# Patient Record
Sex: Female | Born: 1940 | Race: White | Hispanic: No | Smoking: Never smoker
Health system: Southern US, Community
[De-identification: ages and names within clinical notes are randomized; demographics above are authoritative.]

## PROBLEM LIST (undated history)

## (undated) ENCOUNTER — Emergency Department (HOSPITAL_COMMUNITY): Admission: EM | Source: Home / Self Care

## (undated) DIAGNOSIS — J939 Pneumothorax, unspecified: Secondary | ICD-10-CM

## (undated) DIAGNOSIS — E785 Hyperlipidemia, unspecified: Secondary | ICD-10-CM

## (undated) DIAGNOSIS — H3530 Unspecified macular degeneration: Secondary | ICD-10-CM

## (undated) DIAGNOSIS — G4700 Insomnia, unspecified: Secondary | ICD-10-CM

## (undated) DIAGNOSIS — D61818 Other pancytopenia: Secondary | ICD-10-CM

## (undated) DIAGNOSIS — M1990 Unspecified osteoarthritis, unspecified site: Secondary | ICD-10-CM

## (undated) DIAGNOSIS — C801 Malignant (primary) neoplasm, unspecified: Secondary | ICD-10-CM

## (undated) DIAGNOSIS — J189 Pneumonia, unspecified organism: Secondary | ICD-10-CM

## (undated) DIAGNOSIS — C50919 Malignant neoplasm of unspecified site of unspecified female breast: Secondary | ICD-10-CM

## (undated) DIAGNOSIS — K219 Gastro-esophageal reflux disease without esophagitis: Secondary | ICD-10-CM

## (undated) HISTORY — PX: BREAST EXCISIONAL BIOPSY: SUR124

---

## 2005-03-11 ENCOUNTER — Emergency Department: Payer: Self-pay | Admitting: Emergency Medicine

## 2005-07-29 ENCOUNTER — Ambulatory Visit: Payer: Self-pay | Admitting: General Practice

## 2007-02-15 ENCOUNTER — Ambulatory Visit: Payer: Self-pay | Admitting: Internal Medicine

## 2008-02-24 ENCOUNTER — Ambulatory Visit: Payer: Self-pay | Admitting: Family Medicine

## 2008-03-23 ENCOUNTER — Other Ambulatory Visit: Payer: Self-pay

## 2008-03-23 ENCOUNTER — Ambulatory Visit: Payer: Self-pay | Admitting: General Practice

## 2008-04-04 ENCOUNTER — Ambulatory Visit: Payer: Self-pay | Admitting: General Practice

## 2008-04-04 HISTORY — PX: KNEE ARTHROSCOPY: SHX127

## 2008-10-05 ENCOUNTER — Emergency Department: Payer: Self-pay | Admitting: Unknown Physician Specialty

## 2010-02-22 ENCOUNTER — Ambulatory Visit: Payer: Self-pay | Admitting: Family Medicine

## 2010-09-30 ENCOUNTER — Inpatient Hospital Stay: Payer: Self-pay | Admitting: Specialist

## 2010-09-30 HISTORY — PX: ANKLE FRACTURE SURGERY: SHX122

## 2011-09-09 ENCOUNTER — Ambulatory Visit: Payer: Self-pay | Admitting: Internal Medicine

## 2011-10-23 ENCOUNTER — Ambulatory Visit: Payer: Self-pay | Admitting: Gastroenterology

## 2011-10-24 LAB — PATHOLOGY REPORT

## 2012-09-22 ENCOUNTER — Ambulatory Visit: Payer: Self-pay | Admitting: Internal Medicine

## 2013-10-22 DIAGNOSIS — C50919 Malignant neoplasm of unspecified site of unspecified female breast: Secondary | ICD-10-CM

## 2013-10-22 HISTORY — DX: Malignant neoplasm of unspecified site of unspecified female breast: C50.919

## 2013-10-31 ENCOUNTER — Ambulatory Visit: Payer: Self-pay | Admitting: Family Medicine

## 2013-11-14 ENCOUNTER — Ambulatory Visit: Payer: Self-pay | Admitting: Family Medicine

## 2013-11-15 ENCOUNTER — Ambulatory Visit: Payer: Self-pay | Admitting: Family Medicine

## 2013-11-29 ENCOUNTER — Ambulatory Visit: Payer: Self-pay | Admitting: Surgery

## 2013-12-06 ENCOUNTER — Ambulatory Visit: Payer: Self-pay | Admitting: Surgery

## 2013-12-06 HISTORY — PX: PARTIAL MASTECTOMY WITH AXILLARY SENTINEL LYMPH NODE BIOPSY: SHX6004

## 2013-12-20 ENCOUNTER — Ambulatory Visit: Payer: Self-pay | Admitting: Oncology

## 2013-12-29 ENCOUNTER — Ambulatory Visit: Payer: Self-pay | Admitting: Surgery

## 2013-12-29 HISTORY — PX: MASTECTOMY: SHX3

## 2014-01-03 LAB — PATHOLOGY REPORT

## 2014-02-01 ENCOUNTER — Ambulatory Visit: Payer: Self-pay | Admitting: Oncology

## 2014-02-19 ENCOUNTER — Ambulatory Visit: Payer: Self-pay | Admitting: Oncology

## 2014-11-06 ENCOUNTER — Ambulatory Visit: Payer: Self-pay | Admitting: Family Medicine

## 2015-04-13 NOTE — Op Note (Signed)
PATIENT NAME:  Samantha Ford, Samantha Ford MR#:  [id number] DATE OF BIRTH:  December 27, 1940  DATE OF PROCEDURE:  12/06/2013  PREOPERATIVE DIAGNOSIS: Carcinoma of the right breast.   POSTOPERATIVE DIAGNOSIS: Carcinoma of the right breast.   PROCEDURE: Right partial mastectomy with axillary sentinel lymph node biopsy.   SURGEON: Rochel Brome, M.D.   ANESTHESIA: General.   INDICATIONS: This 74 year old female recently had abnormal mammogram with finding of an area of density in the retroareolar portion of the right breast. Ultrasound demonstrated a 1.35 cm lesion, which is superficially located 8 mm deep to the skin at the 11:30 position right breast 1 cm from the nipple. Surgery was recommended for definitive treatment. Did have preoperative injection of radioactive technetium sulfur colloid.   DESCRIPTION OF PROCEDURE: The patient was placed on the operating table in the supine position under general endotracheal anesthesia. The right arm was placed on a lateral arm support. The breast was prepared with ChloraPrep and draped in a sterile manner.   The Gamma counter was used to probe the axilla demonstrating location of radioactivity in the inferior aspect of the axilla. An oblique incision was made some 4 cm in length, carried down through subcutaneous tissues. One traversing vein was divided between 4-0 chromic ligatures. The superficial fascia was incised. Dissection was carried down deeply within the axilla adjacent to the rib cage to encounter an area of radioactivity. There was a small lymph node not more than about 5 mm, which was excised with some surrounding fatty tissue. The ex vivo count was in the range of 300 to 400 counts per second. The background count was less than 10 counts per second There was no remaining palpable mass within the axilla. It is noted that one clamped vessel was suture ligated with 4-0 chromic. Hemostasis was subsequently intact. The sentinel lymph node was submitted for routine  pathology.   Next, attention was turned to do the right partial mastectomy. A curvilinear incision was made from medial to lateral above and below the areola and carried down through subcutaneous tissues, and began to palpate the mass at the 11:30 position, retroareolar portion of the breast and dissection was carried out while palpating the mass. It is noted that there was one place where the dissection came very close to the mass and did back up and re-direct the course of dissection to include a wider margin. The specimen was submitted with a stitch at the medial end of the skin ellipse and also margin maps were sutured to the specimen to mark the medial, lateral, cranial, caudal, and deep margins and this was submitted for pathology. The wound was inspected. Hemostasis was intact. There was no remaining visible mass within the wound.   Next, the axillary wound was further inspected. Hemostasis was intact. The wound was closed with running 4-0 Monocryl subcuticular suture and subsequently, the breast wound was further inspected. The pathologist called to say that the place where the dissection came close to the tumor was appreciated and also could see where I had backed up and obtained a wider margin. The wound was subsequently closed with 4-0 chromic subcutaneous sutures and also a running 4-0 Monocryl subcuticular suture and both wounds were treated with Dermabond. The patient tolerated surgery satisfactorily and was then prepared for transfer to the recovery room    ____________________________ J. Rochel Brome, MD jws:dp D: 12/06/2013 15:42:20 ET T: 12/06/2013 16:13:47 ET JOB#: [id number]  cc: Loreli Dollar, MD, <Dictator> Loreli Dollar MD ELECTRONICALLY SIGNED 12/07/2013 10:34

## 2015-04-14 NOTE — Op Note (Signed)
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   PROCEDURE: Right mastectomy.   SURGEON: Rochel Brome, M.D.   ANESTHESIA: General.   INDICATIONS: This 74 year old female recently had biopsy findings of infiltrating lobular carcinoma of the right breast. She did have a partial mastectomy including the central portion of the right breast. However, there were margins involved three sides of the resection, despite having a fairly large resection and simple mastectomy was recommended for additional treatment. As noted, her sentinel lymph node biopsy was negative.   PROCEDURE IN DETAIL: The patient was placed on the operating table in the supine position under general anesthesia. The right arm was placed on a lateral arm support. The right breast was prepared with ChloraPrep and draped in a sterile manner. It is noted that she had previously had the right partial mastectomy, which included the nipple and areolar.   A curvilinear incision was made from medial to lateral and removed an ellipse of skin in continuity with the underlying breast specimen. Silk sutures were placed along the skin edges to use for traction. Skin and subcutaneous flaps were raised using electrocautery, carrying these flaps down to the inferior mammary fold medially to the sternum, superiorly in the direction of the clavicle and laterally to the latissimus dorsi muscle. Next, the breast was elevated off the underlying deep fascia from medial to lateral with electrocautery and dissected up to include the axillary tail and extended up to the scar tissue at the site of the recent sentinel lymph node biopsy. The lateral end of the skin ellipse was tagged with a stitch for the pathologist's orientation. There was no grossly  apparent tumor in the resected margins and was submitted for routine pathology.   The wound was inspected. Several small bleeding points were cauterized. It is noted that during the course of the procedure, a number of clamped vessels were suture ligated with 3-0 chromic and by the end of the procedure hemostasis was intact. Two 15 French Blake drains were inserted through separate inferior stab wounds; one was placed up into the axilla, another along the anterior chest wall. These were secured to the skin with 3-0 nylon sutures. Next, the skin edges were approximated with running 4-0 Monocryl subcuticular suture and Dermabond. The drains were activated. There was scant serosanguineous drainage. Dressing was applied around the drain site using 4 x 4 gauze, benzoin and 2 inches silk tape.   The patient appeared to be in satisfactory condition and was prepared  for transfer to the recovery room.    ____________________________ Lenna Sciara. Rochel Brome, MD jws:cc D: 12/29/2013 15:00:13 ET T: 12/29/2013 20:40:22 ET JOB#: [id number]  cc: Loreli Dollar, MD, <Dictator> Loreli Dollar MD ELECTRONICALLY SIGNED 01/04/2014 18:08

## 2015-10-31 ENCOUNTER — Other Ambulatory Visit: Payer: Self-pay | Admitting: Family Medicine

## 2015-10-31 DIAGNOSIS — Z1231 Encounter for screening mammogram for malignant neoplasm of breast: Secondary | ICD-10-CM

## 2015-11-09 ENCOUNTER — Ambulatory Visit
Admission: RE | Admit: 2015-11-09 | Discharge: 2015-11-09 | Disposition: A | Discharge status: Home / Self Care | Payer: Medicare Other | Source: Ambulatory Visit | Attending: Family Medicine | Admitting: Family Medicine

## 2015-11-09 ENCOUNTER — Other Ambulatory Visit: Payer: Self-pay | Admitting: Family Medicine

## 2015-11-09 DIAGNOSIS — Z9011 Acquired absence of right breast and nipple: Secondary | ICD-10-CM | POA: Diagnosis not present

## 2015-11-09 DIAGNOSIS — Z1231 Encounter for screening mammogram for malignant neoplasm of breast: Secondary | ICD-10-CM | POA: Insufficient documentation

## 2015-11-09 HISTORY — DX: Malignant (primary) neoplasm, unspecified: C80.1

## 2015-11-14 ENCOUNTER — Other Ambulatory Visit: Payer: Self-pay | Admitting: Family Medicine

## 2015-11-14 DIAGNOSIS — R928 Other abnormal and inconclusive findings on diagnostic imaging of breast: Secondary | ICD-10-CM

## 2015-11-14 DIAGNOSIS — R921 Mammographic calcification found on diagnostic imaging of breast: Secondary | ICD-10-CM

## 2015-11-26 ENCOUNTER — Ambulatory Visit
Admission: RE | Admit: 2015-11-26 | Discharge: 2015-11-26 | Disposition: A | Discharge status: Home / Self Care | Payer: Medicare Other | Source: Ambulatory Visit | Attending: Family Medicine | Admitting: Family Medicine

## 2015-11-26 ENCOUNTER — Other Ambulatory Visit: Payer: Self-pay | Admitting: Family Medicine

## 2015-11-26 DIAGNOSIS — R921 Mammographic calcification found on diagnostic imaging of breast: Secondary | ICD-10-CM

## 2015-11-26 DIAGNOSIS — Z9011 Acquired absence of right breast and nipple: Secondary | ICD-10-CM | POA: Diagnosis not present

## 2015-11-26 DIAGNOSIS — N63 Unspecified lump in unspecified breast: Secondary | ICD-10-CM

## 2015-11-26 DIAGNOSIS — R928 Other abnormal and inconclusive findings on diagnostic imaging of breast: Secondary | ICD-10-CM

## 2015-11-26 DIAGNOSIS — Z853 Personal history of malignant neoplasm of breast: Secondary | ICD-10-CM | POA: Insufficient documentation

## 2015-11-26 HISTORY — DX: Malignant neoplasm of unspecified site of unspecified female breast: C50.919

## 2015-11-29 ENCOUNTER — Other Ambulatory Visit: Payer: Self-pay | Admitting: Family Medicine

## 2015-11-29 ENCOUNTER — Ambulatory Visit
Admission: RE | Admit: 2015-11-29 | Discharge: 2015-11-29 | Disposition: A | Discharge status: Home / Self Care | Payer: Medicare Other | Source: Ambulatory Visit | Attending: Family Medicine | Admitting: Family Medicine

## 2015-11-29 DIAGNOSIS — N63 Unspecified lump in unspecified breast: Secondary | ICD-10-CM

## 2015-11-29 HISTORY — PX: BREAST BIOPSY: SHX20

## 2015-11-30 LAB — SURGICAL PATHOLOGY

## 2016-10-28 ENCOUNTER — Other Ambulatory Visit: Payer: Self-pay | Admitting: Family Medicine

## 2016-10-28 DIAGNOSIS — N63 Unspecified lump in unspecified breast: Secondary | ICD-10-CM

## 2016-12-01 ENCOUNTER — Ambulatory Visit
Admission: RE | Admit: 2016-12-01 | Discharge: 2016-12-01 | Disposition: A | Discharge status: Home / Self Care | Payer: Medicare Other | Source: Ambulatory Visit | Attending: Family Medicine | Admitting: Family Medicine

## 2016-12-01 DIAGNOSIS — N63 Unspecified lump in unspecified breast: Secondary | ICD-10-CM

## 2016-12-01 DIAGNOSIS — N6320 Unspecified lump in the left breast, unspecified quadrant: Secondary | ICD-10-CM | POA: Diagnosis not present

## 2017-06-07 IMAGING — MG MM SCREENING BREAST TOMO UNI L
7 series · 8 of 15 positions shown · non-contrast
Comparison: Previous exam(s).

CLINICAL DATA: Screening. Previous right mastectomy.

EXAM:
DIGITAL SCREENING UNILATERAL LEFT MAMMOGRAM WITH TOMO AND CAD

[L MLO]
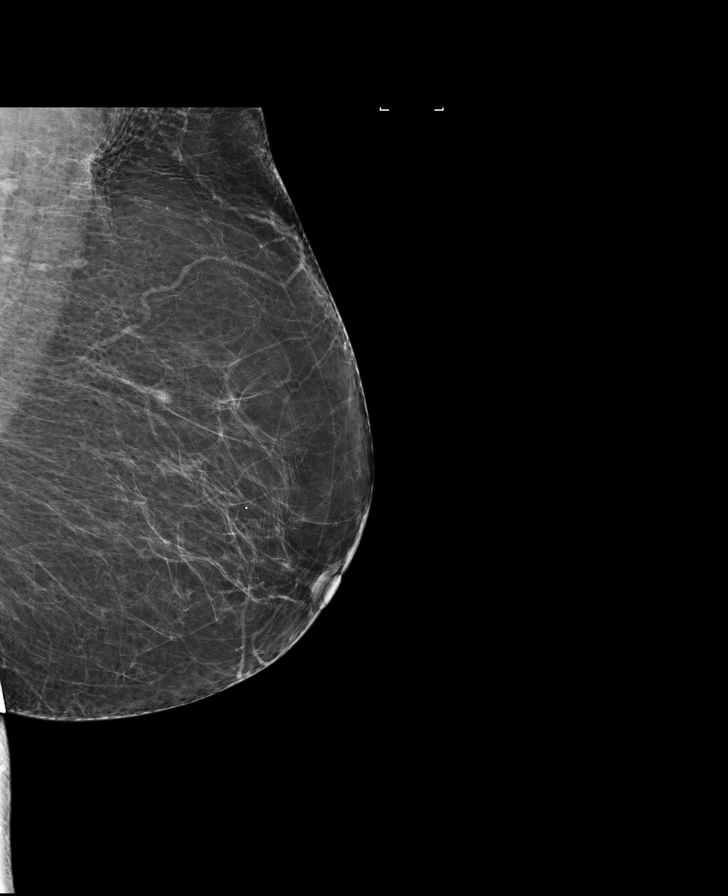

[L MLO synth-2D]
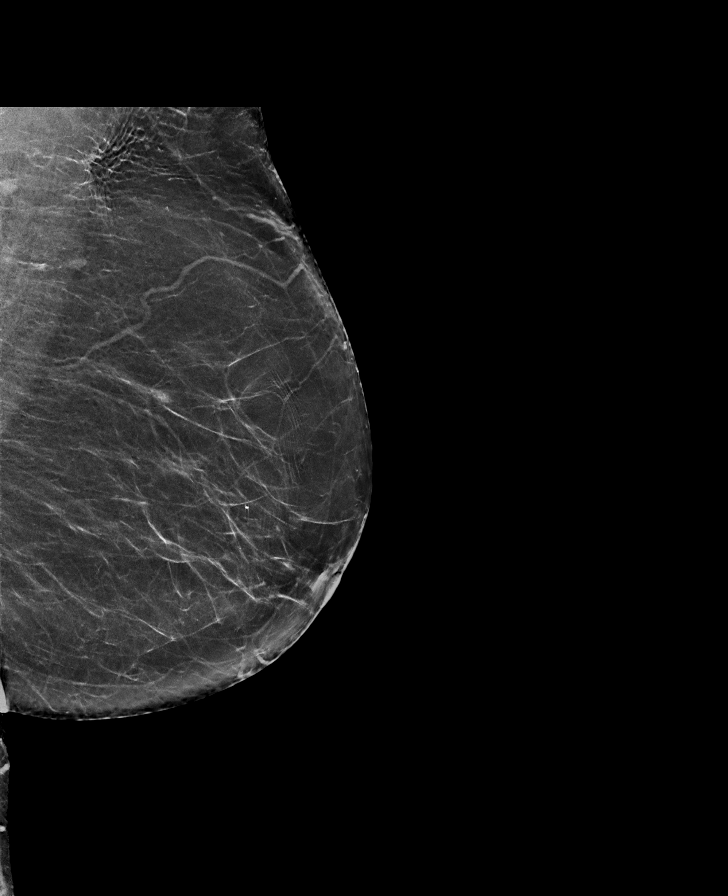

[L CC synth-2D]
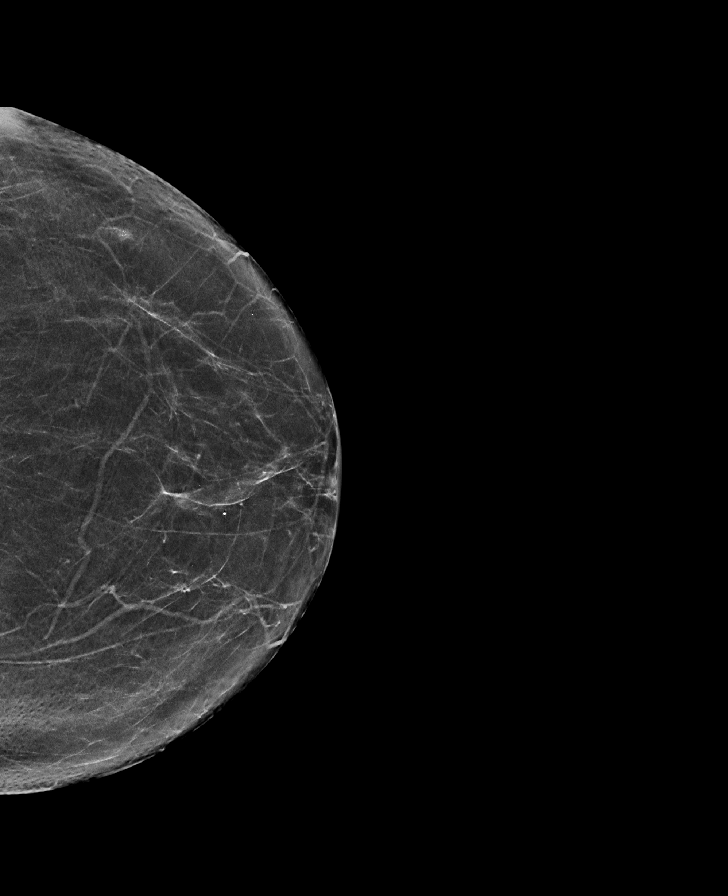

[L CC (1 of 2)]
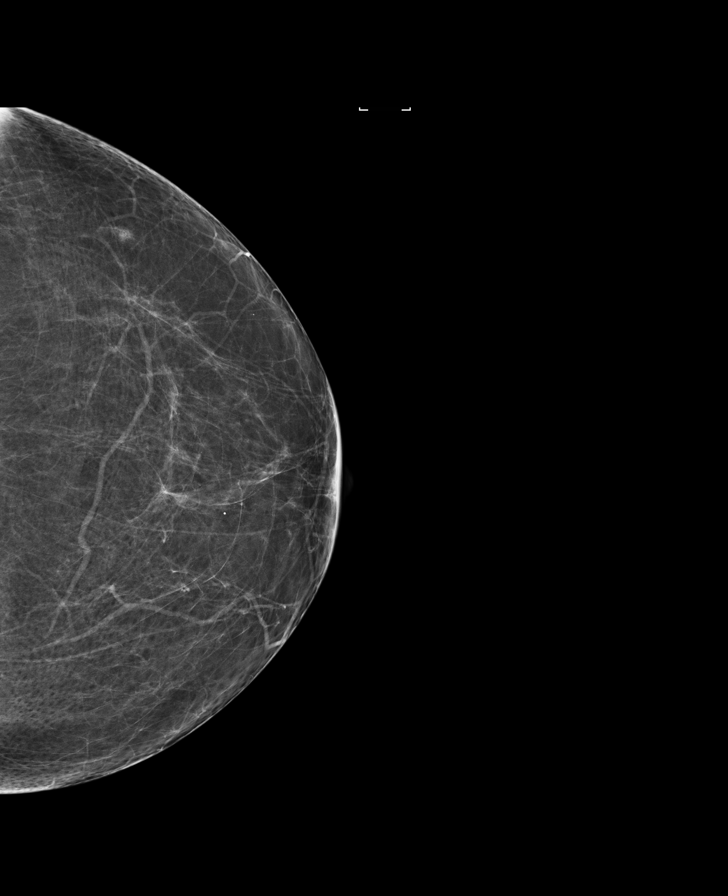

[L CC tomo · 2 of 65 frames shown]
[frame 21/65]
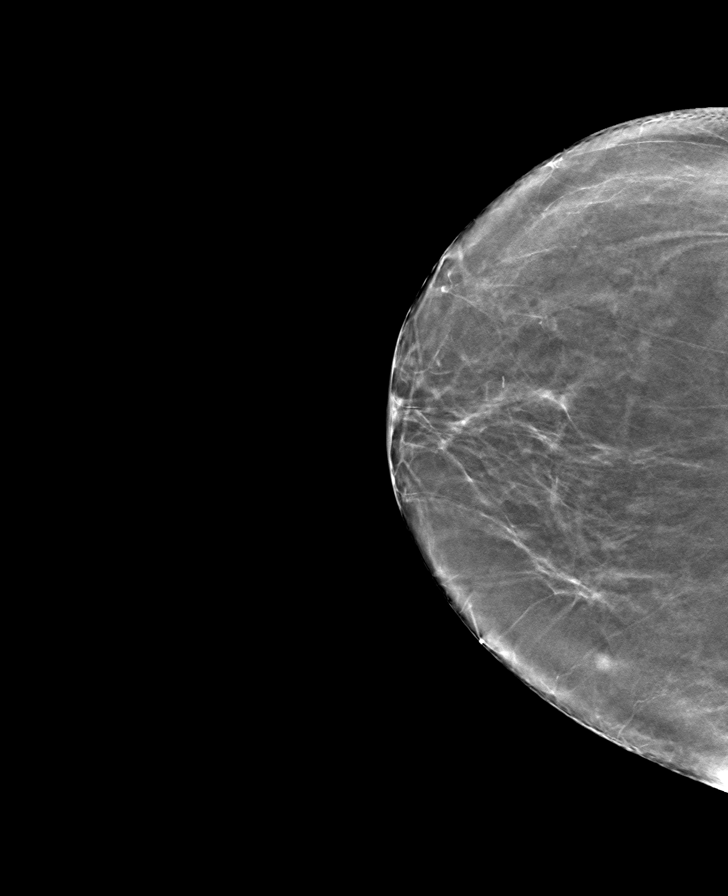
[frame 33/65]
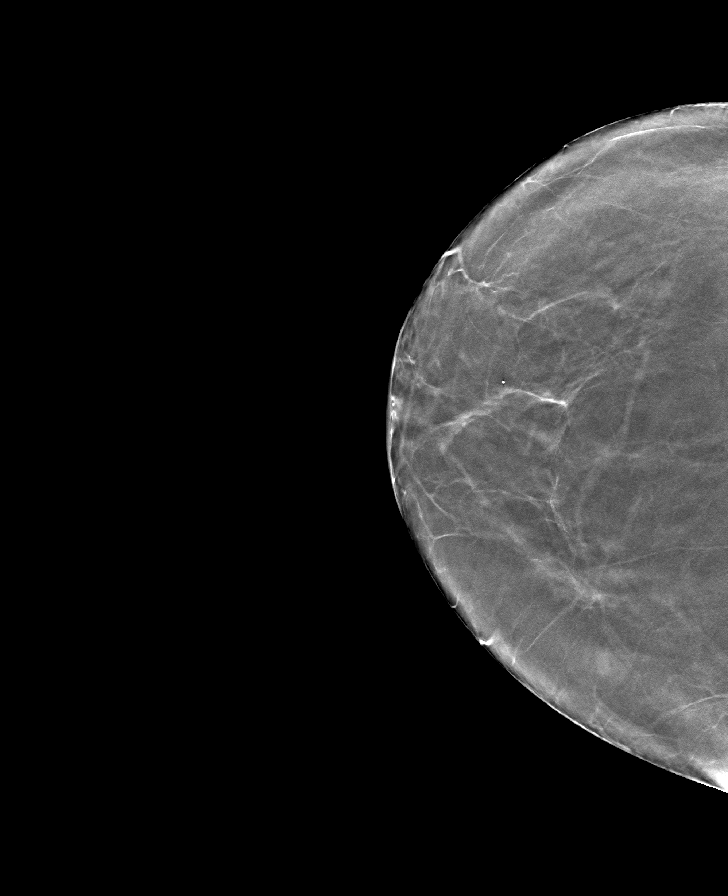

[L MLO tomo · tomo slice 35/69.0]
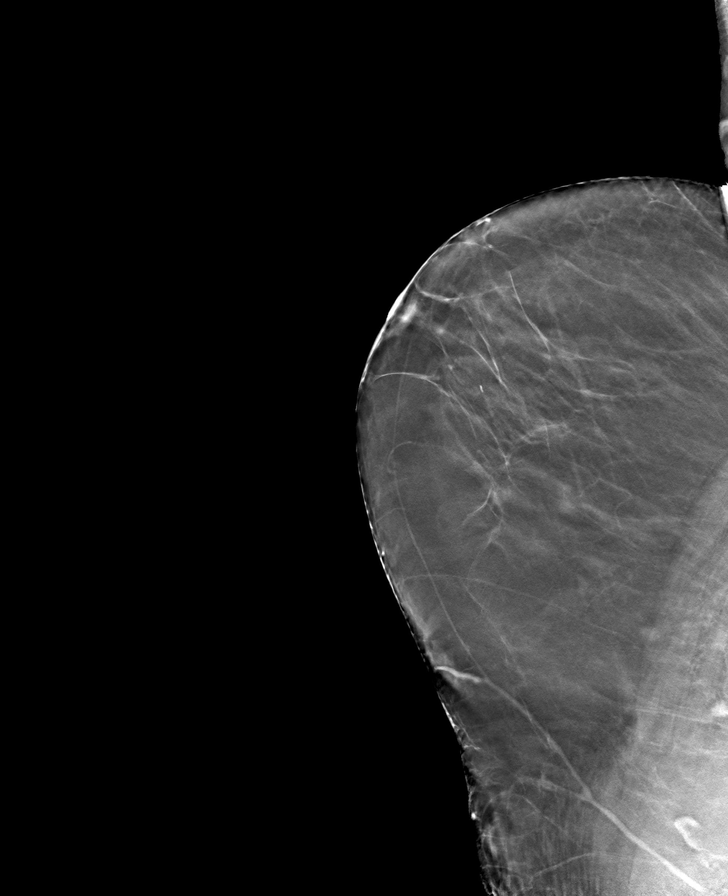

[L CC (2 of 2)]
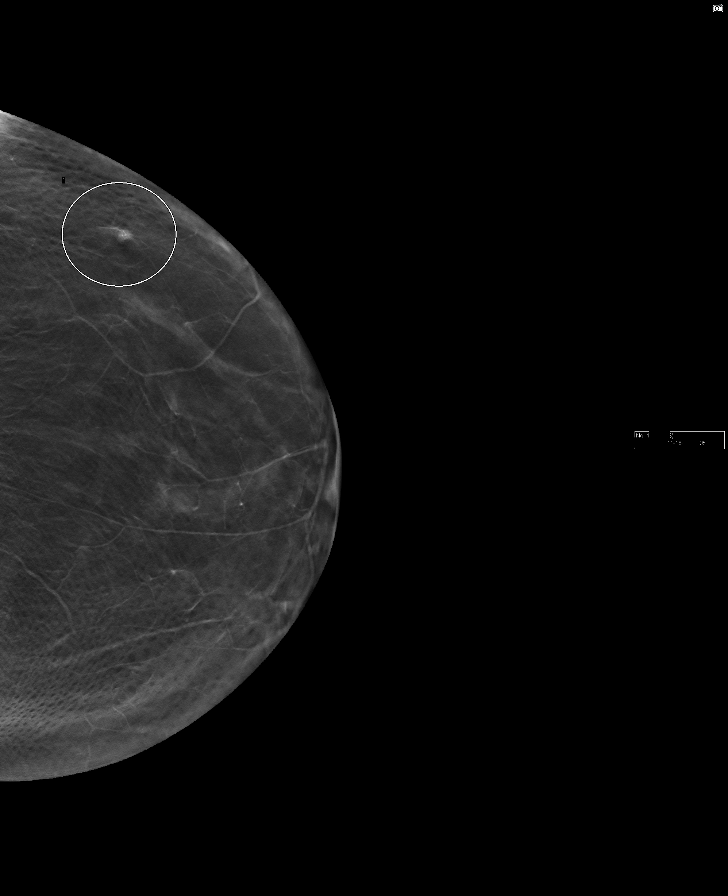

[8 of 15 positions shown; findings below may reference images not displayed]

ACR Breast Density Category b: There are scattered areas of
fibroglandular density.
FINDINGS: In the left breast, a possible mass with calcifications warrants
further evaluation. In the right breast, no findings suspicious for
malignancy.

Images were processed with CAD.
IMPRESSION: Further evaluation is suggested for possible mass with
calcifications in the left breast.

RECOMMENDATION:
Diagnostic mammogram and possibly ultrasound of the left breast.
(Code:[id number]-[id number])

The patient will be contacted regarding the findings, and additional
imaging will be scheduled.

BI-RADS CATEGORY  0: Incomplete. Need additional imaging evaluation
and/or prior mammograms for comparison.

## 2020-01-17 ENCOUNTER — Other Ambulatory Visit: Payer: Self-pay | Admitting: Family Medicine

## 2020-01-17 DIAGNOSIS — Z1231 Encounter for screening mammogram for malignant neoplasm of breast: Secondary | ICD-10-CM

## 2020-01-20 ENCOUNTER — Ambulatory Visit
Admission: RE | Admit: 2020-01-20 | Discharge: 2020-01-20 | Disposition: A | Discharge status: Home / Self Care | Payer: Medicare Other | Source: Ambulatory Visit | Attending: Family Medicine | Admitting: Family Medicine

## 2020-01-20 DIAGNOSIS — Z1231 Encounter for screening mammogram for malignant neoplasm of breast: Secondary | ICD-10-CM

## 2021-06-23 ENCOUNTER — Emergency Department (HOSPITAL_COMMUNITY)
Admission: EM | Admit: 2021-06-23 | Discharge: 2021-06-24 | Disposition: A | Discharge status: Home / Self Care | Payer: Medicare PPO | Attending: Emergency Medicine | Admitting: Emergency Medicine

## 2021-06-23 ENCOUNTER — Other Ambulatory Visit: Payer: Self-pay

## 2021-06-23 ENCOUNTER — Encounter (HOSPITAL_COMMUNITY): Payer: Self-pay

## 2021-06-23 DIAGNOSIS — N390 Urinary tract infection, site not specified: Secondary | ICD-10-CM | POA: Diagnosis not present

## 2021-06-23 DIAGNOSIS — R059 Cough, unspecified: Secondary | ICD-10-CM | POA: Diagnosis present

## 2021-06-23 DIAGNOSIS — R0981 Nasal congestion: Secondary | ICD-10-CM | POA: Diagnosis not present

## 2021-06-23 DIAGNOSIS — U071 COVID-19: Secondary | ICD-10-CM | POA: Diagnosis not present

## 2021-06-23 DIAGNOSIS — Z853 Personal history of malignant neoplasm of breast: Secondary | ICD-10-CM | POA: Diagnosis not present

## 2021-06-23 LAB — CBC
HCT: 39 % (ref 36.0–46.0)
Hemoglobin: 13 g/dL (ref 12.0–15.0)
MCH: 28.8 pg (ref 26.0–34.0)
MCHC: 33.3 g/dL (ref 30.0–36.0)
MCV: 86.5 fL (ref 80.0–100.0)
Platelets: 154 10*3/uL (ref 150–400)
RBC: 4.51 MIL/uL (ref 3.87–5.11)
RDW: 13.9 % (ref 11.5–15.5)
WBC: 3 10*3/uL — ABNORMAL LOW (ref 4.0–10.5)
nRBC: 0 % (ref 0.0–0.2)

## 2021-06-23 LAB — BASIC METABOLIC PANEL
Anion gap: 11 (ref 5–15)
BUN: 16 mg/dL (ref 8–23)
CO2: 23 mmol/L (ref 22–32)
Calcium: 8.5 mg/dL — ABNORMAL LOW (ref 8.9–10.3)
Chloride: 94 mmol/L — ABNORMAL LOW (ref 98–111)
Creatinine, Ser: 0.8 mg/dL (ref 0.44–1.00)
GFR, Estimated: >60 mL/min (ref >60)
Glucose, Bld: 95 mg/dL (ref 70–99)
Potassium: 3.4 mmol/L — ABNORMAL LOW (ref 3.5–5.1)
Sodium: 128 mmol/L — ABNORMAL LOW (ref 135–145)

## 2021-06-23 NOTE — ED Triage Notes (Signed)
Complaints of cold symptoms since Thursday with a decreased appetite due to "nothing taste good". Pt states she has only had one piece of toast since Thursday. Pt had covid one month ago and was feeling better from same when this cold started on Thursday.

## 2021-06-24 ENCOUNTER — Emergency Department (HOSPITAL_COMMUNITY): Payer: Medicare PPO

## 2021-06-24 LAB — URINALYSIS, ROUTINE W REFLEX MICROSCOPIC
Bilirubin Urine: NEGATIVE
Glucose, UA: NEGATIVE mg/dL
Hgb urine dipstick: NEGATIVE
Ketones, ur: 20 mg/dL — AB
Nitrite: POSITIVE — AB
Protein, ur: NEGATIVE mg/dL
Specific Gravity, Urine: 1.005 (ref 1.005–1.030)
pH: 6 (ref 5.0–8.0)

## 2021-06-24 LAB — RESP PANEL BY RT-PCR (FLU A&B, COVID) ARPGX2
Influenza A by PCR: NEGATIVE
Influenza B by PCR: NEGATIVE
SARS Coronavirus 2 by RT PCR: POSITIVE — AB

## 2021-06-24 MED ORDER — CEPHALEXIN 500 MG PO CAPS
500.0000 mg | ORAL_CAPSULE | Freq: Once | ORAL | Status: AC
  Administered 2021-06-24: 500 mg via ORAL
  Filled 2021-06-24: qty 1

## 2021-06-24 MED ORDER — PAXLOVID 10 X 150 MG & 10 X 100MG PO TBPK: 2.0000 | ORAL_TABLET | Freq: Two times a day (BID) | ORAL | 0 refills | Status: AC

## 2021-06-24 MED ORDER — CEPHALEXIN 500 MG PO CAPS: 500.0000 mg | ORAL_CAPSULE | Freq: Three times a day (TID) | ORAL | 0 refills | Status: DC

## 2021-06-24 NOTE — ED Provider Notes (Signed)
[facility] EMERGENCY DEPARTMENT Provider Note   CSN: [id number] Arrival date & time: 06/23/21  2124     History Chief Complaint  Patient presents with   Weakness    Samantha Ford is a 80 y.o. female.  Patient is an 80 year old female with past medical history of breast cancer with prior mastectomy.  Patient presenting today for evaluation of cough and congestion.  This has been worsening over the past 3 days.  Cough intermittently productive of yellow phlegm.  She denies fevers or chills.  She denies chest pain or difficulty breathing.  Patient reports having what she believes was COVID 1 month ago.  This lasted for approximately 3 weeks, then improved for 1 week, now she is feeling ill again.  She denies any COVID contacts or exposures at home, but does report going to an outpatient clinic and believes she may have caught something there.  The history is provided by the patient.      Past Medical History:  Diagnosis Date   Breast cancer ([place]) 10/2013   Mastectomy in 2015   Cancer [facility])    right breast 12/06/2013    There are no problems to display for this patient.   Past Surgical History:  Procedure Laterality Date   BREAST BIOPSY Left 11/29/2015   neg, ribbon clip   BREAST BIOPSY Left 11/29/2015   neg, coil clip    BREAST EXCISIONAL BIOPSY Right    12/06/2013   MASTECTOMY Right    12/29/2013     OB History   No obstetric history on file.     Family History  Problem Relation Age of Onset   Breast cancer Neg Hx        Home Medications Prior to Admission medications   Not on File    Allergies    Patient has no known allergies.  Review of Systems   Review of Systems  All other systems reviewed and are negative.  Physical Exam Updated Vital Signs BP 121/76 (BP Location: Right Arm)   Pulse 83   Temp 98 F (36.7 C) (Oral)   Resp 17   Ht 5' 6.5" (1.689 m)   Wt 72.1 kg   SpO2 97%   BMI 25.28 kg/m   Physical Exam Vitals and nursing note  reviewed.  Constitutional:      General: She is not in acute distress.    Appearance: She is well-developed. She is not diaphoretic.  HENT:     Head: Normocephalic and atraumatic.     Mouth/Throat:     Mouth: Mucous membranes are moist.     Pharynx: No oropharyngeal exudate or posterior oropharyngeal erythema.  Cardiovascular:     Rate and Rhythm: Normal rate and regular rhythm.     Heart sounds: No murmur heard.   No friction rub. No gallop.  Pulmonary:     Effort: Pulmonary effort is normal. No respiratory distress.     Breath sounds: Normal breath sounds. No wheezing.  Abdominal:     General: Bowel sounds are normal. There is no distension.     Palpations: Abdomen is soft.     Tenderness: There is no abdominal tenderness.  Musculoskeletal:        General: No swelling or tenderness. Normal range of motion.     Cervical back: Normal range of motion and neck supple.     Right lower leg: No edema.     Left lower leg: No edema.  Skin:    General: Skin is warm  and dry.  Neurological:     General: No focal deficit present.     Mental Status: She is alert and oriented to person, place, and time.    ED Results / Procedures / Treatments   Labs (all labs ordered are listed, but only abnormal results are displayed) Labs Reviewed  BASIC METABOLIC PANEL - Abnormal; Notable for the following components:      Result Value   Sodium 128 (*)    Potassium 3.4 (*)    Chloride 94 (*)    Calcium 8.5 (*)    All other components within normal limits  CBC - Abnormal; Notable for the following components:   WBC 3.0 (*)    All other components within normal limits  RESP PANEL BY RT-PCR (FLU A&B, COVID) [id number]  URINALYSIS, ROUTINE W REFLEX MICROSCOPIC    EKG EKG Interpretation  Date/Time:  Sunday June 23 2021 23:35:30 EDT Ventricular Rate:  69 PR Interval:  160 QRS Duration: 96 QT Interval:  476 QTC Calculation: 510 R Axis:   -51 Text Interpretation: Sinus rhythm 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  Procedures Procedures   Medications Ordered in ED Medications - No data to display  ED Course  I have reviewed the triage vital signs and the nursing notes.  Pertinent labs & imaging results that were available during my care of the patient were reviewed by me and considered in my medical decision making (see chart for details).    MDM Rules/Calculators/A&P  Patient is an 80 year old female presenting with URI symptoms and positive COVID testing this evening.  Her vitals are stable and there is no hypoxia.  Patient is interested in PACs livid and this will be prescribed.  She also has evidence of a urinary tract infection and will be given Keflex.  Final Clinical Impression(s) / ED Diagnoses Final diagnoses:  None    Rx / DC Orders ED Discharge Orders     None        Veryl Speak, MD 06/24/21 [phone]

## 2021-06-24 NOTE — ED Notes (Signed)
Date and time results received: 06/24/21 1:50 AM   Test: COVID Critical Value: positive   Name of Provider Notified: Dr. Stark Jock   Orders Received? Or Actions Taken?:

## 2021-06-24 NOTE — Discharge Instructions (Addendum)
Begin taking Paxlovid and Keflex as prescribed.  Follow-up with primary doctor in the next week, and return to the ER if symptoms significantly worsen or change.  Isolate at home for the next 5 days.

## 2023-06-05 ENCOUNTER — Other Ambulatory Visit: Payer: Self-pay

## 2023-06-05 ENCOUNTER — Other Ambulatory Visit: Payer: Self-pay | Admitting: Physician Assistant

## 2023-06-05 DIAGNOSIS — M12812 Other specific arthropathies, not elsewhere classified, left shoulder: Secondary | ICD-10-CM

## 2023-06-19 ENCOUNTER — Ambulatory Visit
Admission: RE | Admit: 2023-06-19 | Discharge: 2023-06-19 | Disposition: A | Discharge status: Home / Self Care | Payer: Medicare PPO | Source: Ambulatory Visit | Attending: Physician Assistant | Admitting: Physician Assistant

## 2023-06-19 DIAGNOSIS — M12812 Other specific arthropathies, not elsewhere classified, left shoulder: Secondary | ICD-10-CM

## 2023-07-27 ENCOUNTER — Other Ambulatory Visit: Payer: Self-pay | Admitting: Surgery

## 2023-07-30 ENCOUNTER — Encounter
Admission: RE | Admit: 2023-07-30 | Discharge: 2023-07-30 | Disposition: A | Discharge status: Home / Self Care | Payer: Medicare PPO | Source: Ambulatory Visit | Attending: Surgery | Admitting: Surgery

## 2023-07-30 VITALS — BP 151/78 | HR 71 | Temp 97.6°F | Resp 14 | Ht 66.5 in | Wt 168.0 lb

## 2023-07-30 DIAGNOSIS — R350 Frequency of micturition: Secondary | ICD-10-CM | POA: Diagnosis not present

## 2023-07-30 DIAGNOSIS — R8290 Unspecified abnormal findings in urine: Secondary | ICD-10-CM | POA: Diagnosis not present

## 2023-07-30 DIAGNOSIS — Z01810 Encounter for preprocedural cardiovascular examination: Secondary | ICD-10-CM | POA: Diagnosis not present

## 2023-07-30 DIAGNOSIS — R8271 Bacteriuria: Secondary | ICD-10-CM | POA: Diagnosis not present

## 2023-07-30 DIAGNOSIS — Z01818 Encounter for other preprocedural examination: Secondary | ICD-10-CM | POA: Diagnosis present

## 2023-07-30 DIAGNOSIS — R8281 Pyuria: Secondary | ICD-10-CM | POA: Insufficient documentation

## 2023-07-30 DIAGNOSIS — Z01812 Encounter for preprocedural laboratory examination: Secondary | ICD-10-CM

## 2023-07-30 HISTORY — DX: Unspecified osteoarthritis, unspecified site: M19.90

## 2023-07-30 HISTORY — DX: Pneumonia, unspecified organism: J18.9

## 2023-07-30 HISTORY — DX: Hyperlipidemia, unspecified: E78.5

## 2023-07-30 HISTORY — DX: Gastro-esophageal reflux disease without esophagitis: K21.9

## 2023-07-30 HISTORY — DX: Pneumothorax, unspecified: J93.9

## 2023-07-30 HISTORY — DX: Insomnia, unspecified: G47.00

## 2023-07-30 HISTORY — DX: Unspecified macular degeneration: H35.30

## 2023-07-30 LAB — CBC WITH DIFFERENTIAL/PLATELET
Abs Immature Granulocytes: 0.01 10*3/uL (ref 0.00–0.07)
Basophils Absolute: 0.1 10*3/uL (ref 0.0–0.1)
Basophils Relative: 1 %
Eosinophils Absolute: 0.3 10*3/uL (ref 0.0–0.5)
Eosinophils Relative: 6 %
HCT: 39 % (ref 36.0–46.0)
Hemoglobin: 12.6 g/dL (ref 12.0–15.0)
Immature Granulocytes: 0 %
Lymphocytes Relative: 46 %
Lymphs Abs: 2.2 10*3/uL (ref 0.7–4.0)
MCH: 28.8 pg (ref 26.0–34.0)
MCHC: 32.3 g/dL (ref 30.0–36.0)
MCV: 89 fL (ref 80.0–100.0)
Monocytes Absolute: 0.4 10*3/uL (ref 0.1–1.0)
Monocytes Relative: 9 %
Neutro Abs: 1.8 10*3/uL (ref 1.7–7.7)
Neutrophils Relative %: 38 %
Platelets: 221 10*3/uL (ref 150–400)
RBC: 4.38 MIL/uL (ref 3.87–5.11)
RDW: 13 % (ref 11.5–15.5)
WBC: 4.7 10*3/uL (ref 4.0–10.5)
nRBC: 0 % (ref 0.0–0.2)

## 2023-07-30 LAB — URINALYSIS, ROUTINE W REFLEX MICROSCOPIC
Bilirubin Urine: NEGATIVE
Glucose, UA: NEGATIVE mg/dL
Hgb urine dipstick: NEGATIVE
Ketones, ur: NEGATIVE mg/dL
Nitrite: NEGATIVE
Protein, ur: NEGATIVE mg/dL
Specific Gravity, Urine: 1.003 — ABNORMAL LOW (ref 1.005–1.030)
pH: 6 (ref 5.0–8.0)

## 2023-07-30 LAB — COMPREHENSIVE METABOLIC PANEL WITH GFR
ALT: 14 U/L (ref 0–44)
AST: 23 U/L (ref 15–41)
Albumin: 3.8 g/dL (ref 3.5–5.0)
Alkaline Phosphatase: 54 U/L (ref 38–126)
Anion gap: 12 (ref 5–15)
BUN: 14 mg/dL (ref 8–23)
CO2: 25 mmol/L (ref 22–32)
Calcium: 9 mg/dL (ref 8.9–10.3)
Chloride: 100 mmol/L (ref 98–111)
Creatinine, Ser: 0.86 mg/dL (ref 0.44–1.00)
GFR, Estimated: >60 mL/min
Glucose, Bld: 113 mg/dL — ABNORMAL HIGH (ref 70–99)
Potassium: 4 mmol/L (ref 3.5–5.1)
Sodium: 137 mmol/L (ref 135–145)
Total Bilirubin: 0.4 mg/dL (ref 0.3–1.2)
Total Protein: 7.5 g/dL (ref 6.5–8.1)

## 2023-07-30 LAB — SURGICAL PCR SCREEN
MRSA, PCR: NEGATIVE
Staphylococcus aureus: NEGATIVE

## 2023-07-30 LAB — TYPE AND SCREEN
ABO/RH(D): A NEG
Antibody Screen: NEGATIVE

## 2023-07-30 NOTE — Patient Instructions (Signed)
Your procedure is scheduled on: Thursday, August 15 Report to the Registration Desk on the 1st floor of the [facility] To find out your arrival time, please call [phone] between 1PM - 3PM on: Wednesday, August 14 If your arrival time is 6:00 am, do not arrive before that time as the [facility] entrance doors do not open until 6:00 am.  REMEMBER: Instructions that are not followed completely may result in serious medical risk, up to and including death; or upon the discretion of your surgeon and anesthesiologist your surgery may need to be rescheduled.  Do not eat food after midnight the night before surgery.  No gum chewing or hard candies.  You may however, drink CLEAR liquids up to 2 hours before you are scheduled to arrive for your surgery. Do not drink anything within 2 hours of your scheduled arrival time.  Clear liquids include: - water  - apple juice without pulp - gatorade (not RED colors) - black coffee or tea (Do NOT add milk or creamers to the coffee or tea) Do NOT drink anything that is not on this list.  In addition, your doctor has ordered for you to drink the provided:  Ensure Pre-Surgery Clear Carbohydrate Drink  Drinking this carbohydrate drink up to two hours before surgery helps to reduce insulin resistance and improve patient outcomes. Please complete drinking 2 hours before scheduled arrival time.  One week prior to surgery: starting August 8 Stop Anti-inflammatories (NSAIDS) such as Advil, Aleve, Ibuprofen, Motrin, Naproxen, Naprosyn and Aspirin based products such as Excedrin, Goody's Powder, BC Powder. Stop ANY OVER THE COUNTER supplements until after surgery. Stop vitamin D, magnesium, Vitamin B, preservision AREDs, fish oil, multiple vitamins. You may however, continue to take Tylenol if needed for pain up until the day of surgery.  Continue taking all prescribed medications   TAKE ONLY THESE MEDICATIONS THE MORNING OF SURGERY WITH A SIP OF  WATER:  Omeprazole (Prilosec) - (take one the night before and one on the morning of surgery - helps to prevent nausea after surgery.)  No Alcohol for 24 hours before or after surgery.  No Smoking including e-cigarettes for 24 hours before surgery.  No chewable tobacco products for at least 6 hours before surgery.  No nicotine patches on the day of surgery.  Do not use any "recreational" drugs for at least a week (preferably 2 weeks) before your surgery.  Please be advised that the combination of cocaine and anesthesia may have negative outcomes, up to and including death. If you test positive for cocaine, your surgery will be cancelled.  On the morning of surgery brush your teeth with toothpaste and water, you may rinse your mouth with mouthwash if you wish. Do not swallow any toothpaste or mouthwash.  Use CHG Soap as directed on instruction sheet.  Do not wear jewelry, make-up, hairpins, clips or nail polish.  Do not wear lotions, powders, or perfumes.   Do not shave body hair from the neck down 48 hours before surgery.  Contact lenses, hearing aids and dentures may not be worn into surgery.  Do not bring valuables to the hospital. [facility] is not responsible for any missing/lost belongings or valuables.   Total Shoulder Arthroplasty:  use Benzoyl Peroxide 5% Gel as directed on instruction sheet.  Notify your doctor if there is any change in your medical condition (cold, fever, infection).  Wear comfortable clothing (specific to your surgery type) to the hospital.  After surgery, you can help prevent  lung complications by doing breathing exercises.  Take deep breaths and cough every 1-2 hours. Your doctor may order a device called an Incentive Spirometer to help you take deep breaths.  If you are being admitted to the hospital overnight, leave your suitcase in the car. After surgery it may be brought to your room.  In case of increased patient census, it may be necessary  for you, the patient, to continue your postoperative care in the Same Day Surgery department.  If you are being discharged the day of surgery, you will not be allowed to drive home. You will need a responsible individual to drive you home and stay with you for 24 hours after surgery.   If you are taking public transportation, you will need to have a responsible individual with you.  Please call the Pre-admissions Testing Dept. at [phone] if you have any questions about these instructions.  Surgery Visitation Policy:  Patients having surgery or a procedure may have two visitors.  Children under the age of 55 must have an adult with them who is not the patient.  Inpatient Visitation:    Visiting hours are 7 a.m. to 8 p.m. Up to four visitors are allowed at one time in a patient room. The visitors may rotate out with other people during the day.  One visitor age 6 or older may stay with the patient overnight and must be in the room by 8 p.m.    Pre-operative 5 CHG Bath Instructions   You can play a key role in reducing the risk of infection after surgery. Your skin needs to be as free of germs as possible. You can reduce the number of germs on your skin by washing with CHG (chlorhexidine gluconate) soap before surgery. CHG is an antiseptic soap that kills germs and continues to kill germs even after washing.   DO NOT use if you have an allergy to chlorhexidine/CHG or antibacterial soaps. If your skin becomes reddened or irritated, stop using the CHG and notify one of our RNs at [phone].   Please shower with the CHG soap starting 4 days before surgery using the following schedule:     Please keep in mind the following:  DO NOT shave, including legs and underarms, starting the day of your first shower.   You may shave your face at any point before/day of surgery.  Place clean sheets on your bed the day you start using CHG soap. Use a clean washcloth (not used since being  washed) for each shower. DO NOT sleep with pets once you start using the CHG.   CHG Shower Instructions:  If you choose to wash your hair and private area, wash first with your normal shampoo/soap.  After you use shampoo/soap, rinse your hair and body thoroughly to remove shampoo/soap residue.  Turn the water OFF and apply about 3 tablespoons (45 ml) of CHG soap to a CLEAN washcloth.  Apply CHG soap ONLY FROM YOUR NECK DOWN TO YOUR TOES (washing for 3-5 minutes)  DO NOT use CHG soap on face, private areas, open wounds, or sores.  Pay special attention to the area where your surgery is being performed.  If you are having back surgery, having someone wash your back for you may be helpful. Wait 2 minutes after CHG soap is applied, then you may rinse off the CHG soap.  Pat dry with a clean towel  Put on clean clothes/pajamas   If you choose to wear lotion, please use  ONLY the CHG-compatible lotions on the back of this paper.     Additional instructions for the day of surgery: DO NOT APPLY any lotions, deodorants, cologne, or perfumes.   Put on clean/comfortable clothes.  Brush your teeth.  Ask your nurse before applying any prescription medications to the skin.      CHG Compatible Lotions   Aveeno Moisturizing lotion  Cetaphil Moisturizing Cream  Cetaphil Moisturizing Lotion  Clairol Herbal Essence Moisturizing Lotion, Dry Skin  Clairol Herbal Essence Moisturizing Lotion, Extra Dry Skin  Clairol Herbal Essence Moisturizing Lotion, Normal Skin  Curel Age Defying Therapeutic Moisturizing Lotion with Alpha Hydroxy  Curel Extreme Care Body Lotion  Curel Soothing Hands Moisturizing Hand Lotion  Curel Therapeutic Moisturizing Cream, Fragrance-Free  Curel Therapeutic Moisturizing Lotion, Fragrance-Free  Curel Therapeutic Moisturizing Lotion, Original Formula  Eucerin Daily Replenishing Lotion  Eucerin Dry Skin Therapy Plus Alpha Hydroxy Crme  Eucerin Dry Skin Therapy Plus Alpha  Hydroxy Lotion  Eucerin Original Crme  Eucerin Original Lotion  Eucerin Plus Crme Eucerin Plus Lotion  Eucerin TriLipid Replenishing Lotion  Keri Anti-Bacterial Hand Lotion  Keri Deep Conditioning Original Lotion Dry Skin Formula Softly Scented  Keri Deep Conditioning Original Lotion, Fragrance Free Sensitive Skin Formula  Keri Lotion Fast Absorbing Fragrance Free Sensitive Skin Formula  Keri Lotion Fast Absorbing Softly Scented Dry Skin Formula  Keri Original Lotion  Keri Skin Renewal Lotion Keri Silky Smooth Lotion  Keri Silky Smooth Sensitive Skin Lotion  Nivea Body Creamy Conditioning Oil  Nivea Body Extra Enriched Lotion  Nivea Body Original Lotion  Nivea Body Sheer Moisturizing Lotion Nivea Crme  Nivea Skin Firming Lotion  NutraDerm 30 Skin Lotion  NutraDerm Skin Lotion  NutraDerm Therapeutic Skin Cream  NutraDerm Therapeutic Skin Lotion  ProShield Protective Hand Cream  Provon moisturizing lotion    Preparing for Total Shoulder Arthroplasty  Before surgery, you can play an important role by reducing the number of germs on your skin by using the following products:  Benzoyl Peroxide Gel  o Reduces the number of germs present on the skin  o Applied twice a day to shoulder area starting two days before surgery  Chlorhexidine Gluconate (CHG) Soap  o An antiseptic cleaner that kills germs and bonds with the skin to continue killing germs even after washing  o Used for showering the night before surgery and morning of surgery  BENZOYL PEROXIDE 5% GEL  Please do not use if you have an allergy to benzoyl peroxide. If your skin becomes reddened/irritated stop using the benzoyl peroxide.  Starting two days before surgery, apply as follows:  1. Apply benzoyl peroxide in the morning and at night. Apply after taking a shower. If you are not taking a shower, clean entire shoulder front, back, and side along with the armpit with a clean wet washcloth.  2. Place a  quarter-sized dollop on your shoulder and rub in thoroughly, making sure to cover the front, back, and side of your shoulder, along with the armpit.  2 days before ____ AM ____ PM 1 day before ____ AM ____ PM  3. Do this twice a day for two days. (Last application is the night before surgery, AFTER using the CHG soap).  4. Do NOT apply benzoyl peroxide gel on the day of surgery.     Preoperative Educational Videos for Total Hip, Knee and Shoulder Replacements  To better prepare for surgery, please view our videos that explain the physical activity and discharge planning required to have the best  surgical recovery at [facility]  [email or web address]  Questions? Call [phone] or email [email or web address]

## 2023-08-03 ENCOUNTER — Telehealth: Payer: Self-pay | Admitting: Urgent Care

## 2023-08-03 DIAGNOSIS — Z01812 Encounter for preprocedural laboratory examination: Secondary | ICD-10-CM

## 2023-08-03 DIAGNOSIS — B9620 Unspecified Escherichia coli [E. coli] as the cause of diseases classified elsewhere: Secondary | ICD-10-CM

## 2023-08-03 MED ORDER — SULFAMETHOXAZOLE-TRIMETHOPRIM 800-160 MG PO TABS: 1.0000 | ORAL_TABLET | Freq: Two times a day (BID) | ORAL | 0 refills | Status: DC

## 2023-08-03 NOTE — Progress Notes (Signed)
[facility] Perioperative Services: Pre-Admission/Anesthesia Testing  Abnormal Lab Notification and Treatment Plan of Care   Date: 08/03/23  Name: Samantha Ford MRN:   [id number]  Re: Abnormal labs noted during PAT appointment   Notified:  Provider Name Provider Role Notification Mode  Poggi, Jonny Ruiz, MD Orthopedics (Surgeon) Routed and/or faxed via [facility]   Abnormal Lab Value(s):   Lab Results  Component Value Date   COLORURINE STRAW (A) 07/30/2023   APPEARANCEUR CLEAR (A) 07/30/2023   LABSPEC 1.003 (L) 07/30/2023   PHURINE 6.0 07/30/2023   GLUCOSEU NEGATIVE 07/30/2023   HGBUR NEGATIVE 07/30/2023   BILIRUBINUR NEGATIVE 07/30/2023   KETONESUR NEGATIVE 07/30/2023   PROTEINUR NEGATIVE 07/30/2023   NITRITE NEGATIVE 07/30/2023   LEUKOCYTESUR TRACE (A) 07/30/2023   EPIU 0-5 07/30/2023   WBCU 21-50 07/30/2023   RBCU 0-5 07/30/2023   BACTERIA RARE (A) 07/30/2023   CULT >=100,000 COLONIES/mL ESCHERICHIA COLI (A) 07/30/2023    Clinical Information and Notes:  Patient is scheduled for an elective REVERSE SHOULDER ARTHROPLASTY WITH BIEPS TENODESIS on 08/06/2023  UA performed in PAT consistent with/concerning for infection.  No leukocytosis noted on CBC; WBC 4.7 Renal function: Estimated Creatinine Clearance: 52.3 mL/min (by C-G formula based on SCr of 0.86 mg/dL). Urine C&S added to assess for pathogenically significant growth.  Impression and Plan:  Samantha Ford with a UA that was (+) for infection; reflex culture sent. Culture grew out a clinically significant colony count of Escherichia coli. Contacted patient to discuss. Patient reporting that she is experiencing urinary frequency and urgency.  Patient denies any significant dysuria, abdominal pain, suprapubic pressure, back pain, nausea, vomiting, or fever/chills. Patient with surgery scheduled soon. In efforts to avoid delaying patient's procedure, or have her experience any potentially significant  perioperative complications related to the aforementioned, I would like to proceed with treatment for urinary tract infection.  Allergies reviewed. Culture report also reviewed to ensure culture appropriate coverage is being provided.  Given minimal symptoms, and in efforts to promote antibiotic stewardship, will treat with a  3 day course of SMZ-TMP DS. Patient encouraged to complete the entire course of antibiotics even if she begins to feel better.    Meds ordered this encounter  Medications   sulfamethoxazole-trimethoprim (BACTRIM DS) 800-160 MG tablet    Sig: Take 1 tablet by mouth 2 (two) times daily for 3 days. Increase water intake while taking this medication.    Dispense:  6 tablet    Refill:  0   Patient encouraged to increase her fluid intake as much as possible. Discussed that water is always best to flush the urinary tract. She was advised to avoid caffeine containing fluids until her infections clears, as caffeine can cause her to experience painful bladder spasms.   May use Tylenol as needed for pain/fever should she experience these symptoms.   Patient instructed to call surgeon's office or PAT with any questions or concerns related to the above outlined course of treatment. Additionally, she was instructed to call if she feels like she is getting worse overall while on treatment. Results and treatment plan of care forwarded to primary attending surgeon to make them aware.   Encounter Diagnoses  Name Primary?   Pre-operative laboratory examination Yes   E. coli UTI (urinary tract infection)    Quentin Mulling, MSN, APRN, FNP-C, CEN [facility]  Peri-operative Services Nurse Practitioner Phone: [phone] Fax: [phone] 08/03/23 10:18 AM  NOTE: This note has been prepared using Dragon dictation  software. Despite my best ability to proofread, there is always the potential that unintentional transcriptional errors may still occur from this process.

## 2023-08-05 MED ORDER — LACTATED RINGERS IV SOLN: INTRAVENOUS | Status: DC

## 2023-08-05 MED ORDER — ORAL CARE MOUTH RINSE: 15.0000 mL | Freq: Once | OROMUCOSAL | Status: AC

## 2023-08-05 MED ORDER — CEFAZOLIN SODIUM-DEXTROSE 2-4 GM/100ML-% IV SOLN
2.0000 g | INTRAVENOUS | Status: AC
  Administered 2023-08-06: 2 g via INTRAVENOUS

## 2023-08-05 MED ORDER — CHLORHEXIDINE GLUCONATE 0.12 % MT SOLN
15.0000 mL | Freq: Once | OROMUCOSAL | Status: AC
  Administered 2023-08-06: 15 mL via OROMUCOSAL

## 2023-08-06 ENCOUNTER — Ambulatory Visit: Payer: Medicare PPO

## 2023-08-06 ENCOUNTER — Ambulatory Visit: Payer: Medicare PPO | Admitting: Urgent Care

## 2023-08-06 ENCOUNTER — Ambulatory Visit
Admission: RE | Admit: 2023-08-06 | Discharge: 2023-08-06 | Disposition: A | Discharge status: Home / Self Care | Payer: Medicare PPO | Attending: Surgery | Admitting: Surgery

## 2023-08-06 ENCOUNTER — Encounter
Admission: RE | Disposition: A | Discharge status: Home / Self Care | Payer: Self-pay | Source: Home / Self Care | Attending: Surgery

## 2023-08-06 ENCOUNTER — Other Ambulatory Visit: Payer: Self-pay

## 2023-08-06 ENCOUNTER — Encounter: Payer: Self-pay | Admitting: Surgery

## 2023-08-06 DIAGNOSIS — R2689 Other abnormalities of gait and mobility: Secondary | ICD-10-CM | POA: Insufficient documentation

## 2023-08-06 DIAGNOSIS — R8290 Unspecified abnormal findings in urine: Secondary | ICD-10-CM

## 2023-08-06 DIAGNOSIS — M19012 Primary osteoarthritis, left shoulder: Secondary | ICD-10-CM | POA: Insufficient documentation

## 2023-08-06 DIAGNOSIS — M75122 Complete rotator cuff tear or rupture of left shoulder, not specified as traumatic: Secondary | ICD-10-CM | POA: Diagnosis present

## 2023-08-06 DIAGNOSIS — M7522 Bicipital tendinitis, left shoulder: Secondary | ICD-10-CM | POA: Insufficient documentation

## 2023-08-06 DIAGNOSIS — R8271 Bacteriuria: Secondary | ICD-10-CM

## 2023-08-06 DIAGNOSIS — Z01812 Encounter for preprocedural laboratory examination: Secondary | ICD-10-CM

## 2023-08-06 DIAGNOSIS — R350 Frequency of micturition: Secondary | ICD-10-CM

## 2023-08-06 HISTORY — PX: REVERSE SHOULDER ARTHROPLASTY: SHX5054

## 2023-08-06 LAB — ABO/RH: ABO/RH(D): A NEG

## 2023-08-06 SURGERY — ARTHROPLASTY, SHOULDER, TOTAL, REVERSE
Anesthesia: General | Site: Shoulder | Laterality: Left

## 2023-08-06 MED ORDER — BUPIVACAINE LIPOSOME 1.3 % IJ SUSP
INTRAMUSCULAR | Status: DC | PRN
  Administered 2023-08-06: 20 mL via PERINEURAL

## 2023-08-06 MED ORDER — ONDANSETRON HCL 4 MG/2ML IJ SOLN
INTRAMUSCULAR | Status: AC
  Filled 2023-08-06: qty 2

## 2023-08-06 MED ORDER — ACETAMINOPHEN 10 MG/ML IV SOLN
INTRAVENOUS | Status: DC | PRN
  Administered 2023-08-06: 1000 mg via INTRAVENOUS

## 2023-08-06 MED ORDER — BUPIVACAINE HCL (PF) 0.5 % IJ SOLN
INTRAMUSCULAR | Status: AC
  Filled 2023-08-06: qty 10

## 2023-08-06 MED ORDER — FENTANYL CITRATE (PF) 100 MCG/2ML IJ SOLN
INTRAMUSCULAR | Status: AC
  Filled 2023-08-06: qty 2

## 2023-08-06 MED ORDER — METOCLOPRAMIDE HCL 5 MG/ML IJ SOLN: 5.0000 mg | Freq: Three times a day (TID) | INTRAMUSCULAR | Status: DC | PRN

## 2023-08-06 MED ORDER — MIDAZOLAM HCL 2 MG/2ML IJ SOLN
1.0000 mg | INTRAMUSCULAR | Status: AC | PRN
  Administered 2023-08-06 (×2): 1 mg via INTRAVENOUS

## 2023-08-06 MED ORDER — DEXAMETHASONE SODIUM PHOSPHATE 10 MG/ML IJ SOLN
INTRAMUSCULAR | Status: DC | PRN
  Administered 2023-08-06: 10 mg via INTRAVENOUS

## 2023-08-06 MED ORDER — BUPIVACAINE LIPOSOME 1.3 % IJ SUSP
INTRAMUSCULAR | Status: AC
  Filled 2023-08-06: qty 20

## 2023-08-06 MED ORDER — BUPIVACAINE-EPINEPHRINE (PF) 0.5% -1:200000 IJ SOLN
INTRAMUSCULAR | Status: DC | PRN
  Administered 2023-08-06: 30 mL

## 2023-08-06 MED ORDER — ONDANSETRON HCL 4 MG/2ML IJ SOLN
INTRAMUSCULAR | Status: DC | PRN
  Administered 2023-08-06: 4 mg via INTRAVENOUS

## 2023-08-06 MED ORDER — CHLORHEXIDINE GLUCONATE 0.12 % MT SOLN
OROMUCOSAL | Status: AC
  Filled 2023-08-06: qty 15

## 2023-08-06 MED ORDER — ONDANSETRON HCL 4 MG PO TABS: 4.0000 mg | ORAL_TABLET | Freq: Four times a day (QID) | ORAL | Status: DC | PRN

## 2023-08-06 MED ORDER — LIDOCAINE HCL (CARDIAC) PF 100 MG/5ML IV SOSY
PREFILLED_SYRINGE | INTRAVENOUS | Status: DC | PRN
  Administered 2023-08-06: 60 mg via INTRAVENOUS

## 2023-08-06 MED ORDER — DEXAMETHASONE SODIUM PHOSPHATE 10 MG/ML IJ SOLN
INTRAMUSCULAR | Status: AC
  Filled 2023-08-06: qty 1

## 2023-08-06 MED ORDER — ACETAMINOPHEN 325 MG PO TABS: 325.0000 mg | ORAL_TABLET | Freq: Four times a day (QID) | ORAL | Status: DC | PRN

## 2023-08-06 MED ORDER — OXYCODONE HCL 5 MG/5ML PO SOLN: 5.0000 mg | Freq: Once | ORAL | Status: AC | PRN

## 2023-08-06 MED ORDER — PROPOFOL 10 MG/ML IV BOLUS
INTRAVENOUS | Status: AC
  Filled 2023-08-06: qty 20

## 2023-08-06 MED ORDER — SODIUM CHLORIDE 0.9 % IR SOLN
Status: DC | PRN
  Administered 2023-08-06: 3000 mL

## 2023-08-06 MED ORDER — CEFAZOLIN SODIUM-DEXTROSE 2-4 GM/100ML-% IV SOLN
2.0000 g | Freq: Four times a day (QID) | INTRAVENOUS | Status: DC
  Administered 2023-08-06: 2 g via INTRAVENOUS

## 2023-08-06 MED ORDER — FENTANYL CITRATE (PF) 100 MCG/2ML IJ SOLN
INTRAMUSCULAR | Status: DC | PRN
  Administered 2023-08-06: 50 ug via INTRAVENOUS

## 2023-08-06 MED ORDER — ACETAMINOPHEN 10 MG/ML IV SOLN: 1000.0000 mg | Freq: Once | INTRAVENOUS | Status: DC | PRN

## 2023-08-06 MED ORDER — FENTANYL CITRATE (PF) 100 MCG/2ML IJ SOLN: 25.0000 ug | INTRAMUSCULAR | Status: DC | PRN

## 2023-08-06 MED ORDER — LIDOCAINE HCL (PF) 1 % IJ SOLN
INTRAMUSCULAR | Status: DC | PRN
  Administered 2023-08-06: 2 mL via SUBCUTANEOUS

## 2023-08-06 MED ORDER — PROMETHAZINE HCL 25 MG/ML IJ SOLN: 6.2500 mg | INTRAMUSCULAR | Status: DC | PRN

## 2023-08-06 MED ORDER — CEFAZOLIN SODIUM-DEXTROSE 2-4 GM/100ML-% IV SOLN
INTRAVENOUS | Status: AC
  Filled 2023-08-06: qty 100

## 2023-08-06 MED ORDER — ROCURONIUM BROMIDE 100 MG/10ML IV SOLN
INTRAVENOUS | Status: DC | PRN
  Administered 2023-08-06: 60 mg via INTRAVENOUS
  Administered 2023-08-06: 10 mg via INTRAVENOUS

## 2023-08-06 MED ORDER — MIDAZOLAM HCL 2 MG/2ML IJ SOLN: 1.0000 mg | Freq: Once | INTRAMUSCULAR | Status: DC

## 2023-08-06 MED ORDER — MIDAZOLAM HCL 2 MG/2ML IJ SOLN
INTRAMUSCULAR | Status: AC
  Filled 2023-08-06: qty 2

## 2023-08-06 MED ORDER — ACETAMINOPHEN 10 MG/ML IV SOLN
INTRAVENOUS | Status: AC
  Filled 2023-08-06: qty 100

## 2023-08-06 MED ORDER — ONDANSETRON HCL 4 MG/2ML IJ SOLN: INTRAMUSCULAR | Status: DC | PRN

## 2023-08-06 MED ORDER — 0.9 % SODIUM CHLORIDE (POUR BTL) OPTIME
TOPICAL | Status: DC | PRN
  Administered 2023-08-06: 1000 mL

## 2023-08-06 MED ORDER — SUGAMMADEX SODIUM 200 MG/2ML IV SOLN
INTRAVENOUS | Status: DC | PRN
  Administered 2023-08-06: 200 mg via INTRAVENOUS

## 2023-08-06 MED ORDER — HYDROCODONE-ACETAMINOPHEN 5-325 MG PO TABS: 1.0000 | ORAL_TABLET | Freq: Four times a day (QID) | ORAL | 0 refills | Status: DC | PRN

## 2023-08-06 MED ORDER — METOCLOPRAMIDE HCL 10 MG PO TABS: 5.0000 mg | ORAL_TABLET | Freq: Three times a day (TID) | ORAL | Status: DC | PRN

## 2023-08-06 MED ORDER — BUPIVACAINE HCL (PF) 0.5 % IJ SOLN
INTRAMUSCULAR | Status: DC | PRN
  Administered 2023-08-06: 10 mL via PERINEURAL

## 2023-08-06 MED ORDER — TRANEXAMIC ACID-NACL 1000-0.7 MG/100ML-% IV SOLN
INTRAVENOUS | Status: DC | PRN
Start: 2023-08-06 — End: 2023-08-06
  Administered 2023-08-06: 1000 mg via INTRAVENOUS

## 2023-08-06 MED ORDER — OXYCODONE HCL 5 MG PO TABS
ORAL_TABLET | ORAL | Status: AC
  Filled 2023-08-06: qty 1

## 2023-08-06 MED ORDER — PHENYLEPHRINE HCL-NACL 20-0.9 MG/250ML-% IV SOLN
INTRAVENOUS | Status: AC
  Filled 2023-08-06: qty 250

## 2023-08-06 MED ORDER — HYDROCODONE-ACETAMINOPHEN 5-325 MG PO TABS: 1.0000 | ORAL_TABLET | ORAL | Status: DC | PRN

## 2023-08-06 MED ORDER — BUPIVACAINE-EPINEPHRINE (PF) 0.5% -1:200000 IJ SOLN
INTRAMUSCULAR | Status: AC
  Filled 2023-08-06: qty 10

## 2023-08-06 MED ORDER — DROPERIDOL 2.5 MG/ML IJ SOLN: 0.6250 mg | Freq: Once | INTRAMUSCULAR | Status: DC | PRN

## 2023-08-06 MED ORDER — PHENYLEPHRINE HCL-NACL 20-0.9 MG/250ML-% IV SOLN
INTRAVENOUS | Status: DC | PRN
  Administered 2023-08-06: 25 ug/min via INTRAVENOUS

## 2023-08-06 MED ORDER — SODIUM CHLORIDE 0.9 % IV SOLN: INTRAVENOUS | Status: DC

## 2023-08-06 MED ORDER — LIDOCAINE HCL (PF) 1 % IJ SOLN
INTRAMUSCULAR | Status: AC
  Filled 2023-08-06: qty 5

## 2023-08-06 MED ORDER — OXYCODONE HCL 5 MG PO TABS
5.0000 mg | ORAL_TABLET | Freq: Once | ORAL | Status: AC | PRN
  Administered 2023-08-06: 5 mg via ORAL

## 2023-08-06 MED ORDER — PROPOFOL 10 MG/ML IV BOLUS
INTRAVENOUS | Status: DC | PRN
  Administered 2023-08-06: 100 mg via INTRAVENOUS
  Administered 2023-08-06: 30 mg via INTRAVENOUS

## 2023-08-06 MED ORDER — TRANEXAMIC ACID-NACL 1000-0.7 MG/100ML-% IV SOLN
INTRAVENOUS | Status: AC
  Filled 2023-08-06: qty 100

## 2023-08-06 MED ORDER — BUPIVACAINE LIPOSOME 1.3 % IJ SUSP: INTRAMUSCULAR | Status: DC | PRN

## 2023-08-06 MED ORDER — ONDANSETRON HCL 4 MG/2ML IJ SOLN: 4.0000 mg | Freq: Four times a day (QID) | INTRAMUSCULAR | Status: DC | PRN

## 2023-08-06 MED ORDER — PHENYLEPHRINE 80 MCG/ML (10ML) SYRINGE FOR IV PUSH (FOR BLOOD PRESSURE SUPPORT)
PREFILLED_SYRINGE | INTRAVENOUS | Status: AC
  Filled 2023-08-06: qty 10

## 2023-08-06 MED ORDER — PHENYLEPHRINE HCL (PRESSORS) 10 MG/ML IV SOLN
INTRAVENOUS | Status: DC | PRN
  Administered 2023-08-06: 80 ug via INTRAVENOUS
  Administered 2023-08-06: 40 ug via INTRAVENOUS
  Administered 2023-08-06: 100 ug via INTRAVENOUS

## 2023-08-06 SURGICAL SUPPLY — 69 items
APL PRP STRL LF DISP 70% ISPRP (MISCELLANEOUS) ×1
BASEPLATE GLENOSPHERE 25 (Plate) IMPLANT
BEARING HUMERAL SHLDER 36M STD (Shoulder) IMPLANT
BIT DRILL TWIST 2.7 (BIT) IMPLANT
BLADE SAW SAG 25X90X1.19 (BLADE) ×1 IMPLANT
BRNG HUM STD 36 RVRS SHLDR (Shoulder) ×1 IMPLANT
CHLORAPREP W/TINT 26 (MISCELLANEOUS) ×1 IMPLANT
COOLER POLAR GLACIER W/PUMP (MISCELLANEOUS) ×1 IMPLANT
DRAPE INCISE IOBAN 66X45 STRL (DRAPES) ×1 IMPLANT
DRAPE SHEET LG 3/4 BI-LAMINATE (DRAPES) ×1 IMPLANT
DRAPE TABLE BACK 80X90 (DRAPES) ×1 IMPLANT
DRSG OPSITE POSTOP 4X8 (GAUZE/BANDAGES/DRESSINGS) ×1 IMPLANT
DRSG TEGADERM 4X4.75 (GAUZE/BANDAGES/DRESSINGS) IMPLANT
ELECT BLADE 6.5 EXT (BLADE) IMPLANT
ELECT CAUTERY BLADE 6.4 (BLADE) ×1 IMPLANT
ELECT REM PT RETURN 9FT ADLT (ELECTROSURGICAL) ×1
ELECTRODE REM PT RTRN 9FT ADLT (ELECTROSURGICAL) ×1 IMPLANT
GAUZE XEROFORM 1X8 LF (GAUZE/BANDAGES/DRESSINGS) ×1 IMPLANT
GLENOID SPHERE 36MM CVD +3 (Orthopedic Implant) IMPLANT
GLOVE BIO SURGEON STRL SZ7.5 (GLOVE) ×4 IMPLANT
GLOVE BIO SURGEON STRL SZ8 (GLOVE) ×4 IMPLANT
GLOVE BIOGEL PI IND STRL 8 (GLOVE) ×2 IMPLANT
GLOVE INDICATOR 8.0 STRL GRN (GLOVE) ×1 IMPLANT
GOWN STRL REUS W/ TWL LRG LVL3 (GOWN DISPOSABLE) ×1 IMPLANT
GOWN STRL REUS W/ TWL XL LVL3 (GOWN DISPOSABLE) ×1 IMPLANT
GOWN STRL REUS W/TWL LRG LVL3 (GOWN DISPOSABLE) ×1
GOWN STRL REUS W/TWL XL LVL3 (GOWN DISPOSABLE) ×1
HANDLE YANKAUER SUCT OPEN TIP (MISCELLANEOUS) ×1 IMPLANT
HOOD PEEL AWAY T7 (MISCELLANEOUS) ×3 IMPLANT
IV NS IRRIG 3000ML ARTHROMATIC (IV SOLUTION) ×1 IMPLANT
KIT STABILIZATION SHOULDER (MISCELLANEOUS) ×1 IMPLANT
KIT TURNOVER KIT A (KITS) ×1 IMPLANT
MANIFOLD NEPTUNE II (INSTRUMENTS) ×1 IMPLANT
MASK FACE SPIDER DISP (MASK) ×1 IMPLANT
MAT ABSORB FLUID 56X50 GRAY (MISCELLANEOUS) ×1 IMPLANT
NDL MAYO CATGUT SZ1 (NEEDLE) IMPLANT
NDL SAFETY ECLIP 18X1.5 (MISCELLANEOUS) ×1 IMPLANT
NDL SPNL 20GX3.5 QUINCKE YW (NEEDLE) ×1 IMPLANT
NEEDLE MAYO CATGUT SZ1 (NEEDLE) IMPLANT
NEEDLE SPNL 20GX3.5 QUINCKE YW (NEEDLE) ×1 IMPLANT
NS IRRIG 500ML POUR BTL (IV SOLUTION) ×1 IMPLANT
PACK ARTHROSCOPY SHOULDER (MISCELLANEOUS) ×1 IMPLANT
PAD ARMBOARD 7.5X6 YLW CONV (MISCELLANEOUS) ×1 IMPLANT
PAD WRAPON POLAR SHDR UNIV (MISCELLANEOUS) ×1 IMPLANT
PIN THREADED REVERSE (PIN) IMPLANT
PULSAVAC PLUS IRRIG FAN TIP (DISPOSABLE) ×1
SCREW BONE CORT 6.5X35MM (Screw) IMPLANT
SCREW LOCKING 4.75MMX15MM (Screw) IMPLANT
SCREW LOCKING NS 4.75MMX20MM (Screw) IMPLANT
SCREW LOCKING STRL 4.75X25X3.5 (Screw) IMPLANT
SHOULDER HUMERAL BEAR 36M STD (Shoulder) ×1 IMPLANT
SLING ULTRA II M (MISCELLANEOUS) IMPLANT
SPONGE T-LAP 18X18 ~~LOC~~+RFID (SPONGE) ×2 IMPLANT
STAPLER SKIN PROX 35W (STAPLE) ×1 IMPLANT
STEM HUMERAL MICRO SZ10 (Stem) IMPLANT
SUT ETHIBOND 0 MO6 C/R (SUTURE) ×1 IMPLANT
SUT FIBERWIRE #2 38 BLUE 1/2 (SUTURE) ×4
SUT VIC AB 0 CT1 36 (SUTURE) ×1 IMPLANT
SUT VIC AB 2-0 CT1 27 (SUTURE) ×2
SUT VIC AB 2-0 CT1 TAPERPNT 27 (SUTURE) ×2 IMPLANT
SUTURE FIBERWR #2 38 BLUE 1/2 (SUTURE) ×4 IMPLANT
SYR 10ML LL (SYRINGE) ×1 IMPLANT
SYR 30ML LL (SYRINGE) ×1 IMPLANT
SYR TOOMEY 50ML (SYRINGE) ×1 IMPLANT
TIP FAN IRRIG PULSAVAC PLUS (DISPOSABLE) ×1 IMPLANT
TRAP FLUID SMOKE EVACUATOR (MISCELLANEOUS) ×1 IMPLANT
TRAY HUMERAL NEUTRAL EXT 6 (Shoulder) IMPLANT
WATER STERILE IRR 500ML POUR (IV SOLUTION) ×1 IMPLANT
WRAPON POLAR PAD SHDR UNIV (MISCELLANEOUS) ×1

## 2023-08-06 NOTE — Anesthesia Procedure Notes (Signed)
Anesthesia Regional Block: Interscalene brachial plexus block   Pre-Anesthetic Checklist: , timeout performed,  Correct Patient, Correct Site, Correct Laterality,  Correct Procedure, Correct Position, site marked,  Risks and benefits discussed,  Surgical consent,  Pre-op evaluation,  At surgeon's request and post-op pain management  Laterality: Upper and Left  Prep: chloraprep       Needles:  Injection technique: Single-shot  Needle Type: Echogenic Stimulator Needle     Needle Length: 9cm  Needle Gauge: 20     Additional Needles:   Procedures:,,,, ultrasound used (permanent image in chart),,    Narrative:  End time: 08/06/2023 9:42 AM  Performed by: Personally  Anesthesiologist: Yevette Edwards, MD  Additional Notes: Pt. Identified and accepting of procedure after risks and benefits fully reviewed and questions answered. Time out performed and laterality confirmed prior to procedure.  ISNB  performed without difficulty and well tolerated.  Neg IV and SATD.  No pain on injection of Local anesthetic and VSST.

## 2023-08-06 NOTE — Op Note (Signed)
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  Post-Op Diagnosis:   Same  Procedure:   Reverse left total shoulder arthroplasty with biceps tenodesis.  Surgeon:   Maryagnes Amos, MD  Assistant:   Horris Latino, PA-C  Anesthesia:   General endotracheal with an interscalene block using Exparel placed preoperatively by the anesthesiologist.  Findings:   As above.  Complications:   None  EBL:   100 cc  Fluids:   600 cc crystalloid  UOP:   None  TT:   None  Drains:   None  Closure:   Staples  Implants:   All press-fit [employer] Comprehensive system with a #10 identity micro-humeral stem, a -6 mm extended neutral identity humeral tray with a +0 mm insert, and a mini-base plate with a 36 mm +3 mm lateralized glenosphere.  Brief Clinical Note:   The patient is an 82 year old female with a long history of progressively worsening pain and weakness of the left shoulder.  The patient's symptoms have progressed despite medications, activity modification, etc. The patient's history and examination are consistent with a massive irreparable rotator cuff tear with cuff arthropathy, all of which were confirmed by MRI scan preoperatively. The patient presents at this time for a reverse left total shoulder arthroplasty.  Procedure:   The patient underwent placement of an interscalene block using Exparel by the anesthesiologist in the preoperative holding area before being brought into the operating room and lain in the supine position. The patient then underwent general endotracheal intubation and anesthesia before the patient was repositioned in the beach chair position using the beach chair positioner. The left shoulder and upper extremity were prepped with ChloraPrep solution before being draped sterilely. Preoperative antibiotics were administered. A timeout was  performed to verify the appropriate surgical site.    A standard anterior approach to the shoulder was made through an approximately 4-5 inch incision. The incision was carried down through the subcutaneous tissues to expose the deltopectoral fascia. The interval between the deltoid and pectoralis muscles was identified and this plane developed, retracting the cephalic vein laterally with the deltoid muscle. The conjoined tendon was identified. Its lateral margin was dissected and the Kolbel self-retraining retractor inserted. The "three sisters" were identified and cauterized. Bursal tissues were removed to improve visualization.   The biceps tendon was identified near the inferior aspect of the bicipital groove. A soft tissue tenodesis was performed by attaching the biceps tendon to the adjacent pectoralis major tendon using two #0 Ethibond interrupted sutures. The biceps tendon was then transected just proximal to the tenodesis site. The subscapularis tendon was released from its attachment to the lesser tuberosity 1 cm proximal to its insertion and several tagging sutures placed. The inferior capsule was released with care after identifying and protecting the axillary nerve. The proximal humeral cut was made at approximately 25 of retroversion using the extra-medullary guide.   Attention was redirected to the glenoid. The labrum was debrided circumferentially before the center of the glenoid was marked with electrocautery. The guidewire was drilled into the glenoid vault using the appropriate guide. After verifying its position, it was overreamed with the mini-baseplate reamer to create a flat surface. The permanent mini-baseplate was impacted into place. It was stabilized with a 35 x 6.5 mm central screw and four peripheral locking screws. The permanent 36 mm +3 mm lateralized glenosphere was then impacted into  place and its Morse taper locking mechanism verified using manual distraction.  Attention  was directed to the humeral side. The humeral canal was reamed sequentially beginning with the end-cutting reamer then progressing from a 4 mm reamer up to a 10 mm reamer. This provided excellent circumferential chatter. The canal was broached beginning with a #8 broach and progressing to a #10 broach.  The plastic stem was inserted into the end of the broach and the proximal reaming performed. A trial reduction was performed using the -6 mm extended neutral humeral platform with the +0 mm insert. With the +0 mm insert, the arm demonstrated excellent range of motion as the hand could be brought across the chest to the opposite shoulder and brought to the top of the patient's head and to the patient's ear. The shoulder appeared stable throughout this range of motion. The joint was dislocated and the trial components removed.   The permanent #10 Identity micro-stem was connected with the -6 mm extended neutral humeral platform on the back table before this construct was impacted into place with care taken to maintain the appropriate version. The +0 mm insert was snapped into place. The shoulder was relocated using two finger pressure and again placed through a range of motion with the findings as described above.  The wound was copiously irrigated with sterile saline solution using the jet lavage system before a total of 30 cc of 0.5% Sensorcaine with epinephrine was injected into the pericapsular and peri-incisional tissues to help with postoperative analgesia. The subscapularis tendon was reapproximated using #2 FiberWire interrupted sutures. The deltopectoral interval was closed using #0 Vicryl interrupted sutures before the subcutaneous tissues were closed using 2-0 Vicryl interrupted sutures. The skin was closed using staples. A sterile occlusive dressing was applied to the wound before the arm was placed into a shoulder immobilizer with an abduction pillow. A Polar Care system also was applied to the  shoulder. The patient was then transferred back to a hospital bed before being awakened, extubated, and returned to the recovery room in satisfactory condition after tolerating the procedure well.

## 2023-08-06 NOTE — Anesthesia Preprocedure Evaluation (Signed)
Anesthesia Evaluation  Patient identified by MRN, date of birth, ID band Patient awake    Reviewed: Allergy & Precautions, H&P , NPO status , Patient's Chart, lab work & pertinent test results, reviewed documented beta blocker date and time   Airway Mallampati: II  TM Distance: >3 FB Neck ROM: full    Dental  (+) Upper Dentures, Lower Dentures   Pulmonary pneumonia, resolved   Pulmonary exam normal        Cardiovascular Exercise Tolerance: Poor negative cardio ROS Normal cardiovascular exam Rhythm:regular Rate:Normal     Neuro/Psych negative neurological ROS  negative psych ROS   GI/Hepatic Neg liver ROS,GERD  Medicated,,  Endo/Other  negative endocrine ROS    Renal/GU negative Renal ROS  negative genitourinary   Musculoskeletal   Abdominal   Peds  Hematology negative hematology ROS (+)   Anesthesia Other Findings Past Medical History: 10/22/2013: Breast cancer (HCC)     Comment:  Mastectomy in 2015 No date: Cancer [facility])     Comment:  right breast 12/06/2013 No date: GERD (gastroesophageal reflux disease) No date: Hyperlipidemia No date: Insomnia No date: Macular degeneration of right eye No date: Osteoarthritis No date: Pneumonia No date: Pneumothorax Past Surgical History: 09/30/2010: ANKLE FRACTURE SURGERY; Right 11/29/2015: BREAST BIOPSY; Left     Comment:  neg, ribbon clip 11/29/2015: BREAST BIOPSY; Left     Comment:  neg, coil clip  04/04/2008: KNEE ARTHROSCOPY; Right 12/29/2013: MASTECTOMY; Right 12/06/2013: PARTIAL MASTECTOMY WITH AXILLARY SENTINEL LYMPH NODE  BIOPSY; Right BMI    Body Mass Index: 26.71 kg/m     Reproductive/Obstetrics negative OB ROS                             Anesthesia Physical Anesthesia Plan  ASA: 3  Anesthesia Plan: General ETT   Post-op Pain Management: Regional block*   Induction:   PONV Risk Score and Plan: 4 or  greater  Airway Management Planned:   Additional Equipment:   Intra-op Plan:   Post-operative Plan:   Informed Consent: I have reviewed the patients History and Physical, chart, labs and discussed the procedure including the risks, benefits and alternatives for the proposed anesthesia with the patient or authorized representative who has indicated his/her understanding and acceptance.     Dental Advisory Given  Plan Discussed with: CRNA  Anesthesia Plan Comments:        Anesthesia Quick Evaluation

## 2023-08-06 NOTE — Anesthesia Procedure Notes (Signed)
Procedure Name: Intubation Date/Time: 08/06/2023 10:30 AM  Performed by: Charm Stenner, [place], CRNAPre-anesthesia Checklist: Patient identified, Patient being monitored, Timeout performed, Emergency Drugs available and Suction available Patient Re-evaluated:Patient Re-evaluated prior to induction Oxygen Delivery Method: Circle system utilized Preoxygenation: Pre-oxygenation with 100% oxygen Induction Type: IV induction Ventilation: Mask ventilation without difficulty Laryngoscope Size: McGraph and 3 Grade View: Grade I Tube type: Oral Tube size: 7.0 mm Number of attempts: 1 Airway Equipment and Method: Stylet Placement Confirmation: ETT inserted through vocal cords under direct vision, positive ETCO2 and breath sounds checked- equal and bilateral Secured at: 21 cm Tube secured with: Tape Dental Injury: Teeth and Oropharynx as per pre-operative assessment

## 2023-08-06 NOTE — Evaluation (Addendum)
Occupational Therapy Evaluation Patient Details Name: Samantha Ford MRN: [id number] DOB: [birth year] Today's Date: 08/06/2023   History of Present Illness Pt is an 82 year old female s/p reverse left total shoulder arthroplasty on 08/06/2023   Clinical Impression   Chart reviewed, pt greeted in chair, alert and oriented x4, agreeable to OT evaluation. Prior to surgery, pt was active and independent. Pt has orders for LUE to be immobilized and will be NWBing per MD. Patient presents with impaired strength/ROM, pain, and sensation.  These impairments result in a decreased ability to perform self care tasks requiring MAX assist for UB dressing and bathing and max assist for application of polar care, compression stockings, and sling/immobilizer. Pt instructed in polar care mgt, compression stockings mgt, sling/immobilizer mgt, ROM exercises for LUE, LUE precautions, adaptive strategies for bathing/dressing/toileting/grooming, positioning and considerations for sleep, and home/routines modifications to maximize falls prevention, safety, and independence. Handout provided. OT adjusted sling/immobilizer and polar care to improve comfort, optimize positioning, and to maximize skin integrity/safety. Family demo'd sling care with supervision.  Pt verbalized understanding of all education/training provided. Pt will benefit from skilled OT services to address these limitations and improve independence in daily tasks. OT will follow acutely.   Vitals pre session 121/71 (MAP 85) spo2 >90% on RA, HR in the 80s bpm Post mobility: 125/70 (MAP 87) spo2 93% on RA, HR in the 80s bpm       If plan is discharge home, recommend the following: A little help with walking and/or transfers;A little help with bathing/dressing/bathroom;Assistance with cooking/housework;Help with stairs or ramp for entrance;Direct supervision/assist for financial management;Assist for transportation    Functional Status Assessment  Patient  has had a recent decline in their functional status and demonstrates the ability to make significant improvements in function in a reasonable and predictable amount of time.  Equipment Recommendations  None recommended by OT    Recommendations for Other Services       Precautions / Restrictions Precautions Precautions: Shoulder Shoulder Interventions: Shoulder sling/immobilizer Required Braces or Orthoses: Sling Restrictions Weight Bearing Restrictions: Yes LUE Weight Bearing: Non weight bearing      Mobility Bed Mobility               General bed mobility comments: NT in recliner pre/post session, pt reports she will initially sleep in recliner    Transfers Overall transfer level: Needs assistance Equipment used: None Transfers: Sit to/from Stand Sit to Stand: Supervision                  Balance Overall balance assessment: Needs assistance Sitting-balance support: Feet supported Sitting balance-Leahy Scale: Good     Standing balance support: No upper extremity supported Standing balance-Leahy Scale: Good                             ADL either performed or assessed with clinical judgement   ADL Overall ADL's : Needs assistance/impaired Eating/Feeding: Set up;Sitting   Grooming: Wash/dry hands;Standing;Supervision/safety;Contact guard assist Grooming Details (indicate cue type and reason): sink level         Upper Body Dressing : Maximal assistance Upper Body Dressing Details (indicate cue type and reason): for sling, polar care, shirt; family able to demo with supervision; Lower Body Dressing: Minimal assistance;Sit to/from stand Lower Body Dressing Details (indicate cue type and reason): doff pants/underwear Toilet Transfer: Supervision/safety;Contact guard assist;Ambulation Statistician Details (indicate cue type and reason): intermittent vcs for technique Toileting- Clothing  Manipulation and Hygiene:  Supervision/safety;Sitting/lateral lean       Functional mobility during ADLs: Supervision/safety;Contact guard assist (CGA, progress to supervision approx 200') General ADL Comments: 4 steps using R rail 2 attempts with CGA, then close supervision second attempt     Vision Patient Visual Report: No change from baseline              Pertinent Vitals/Pain Pain Assessment Pain Assessment: No/denies pain     Extremity/Trunk Assessment Upper Extremity Assessment Upper Extremity Assessment: Right hand dominant;LUE deficits/detail LUE Deficits / Details: LUE in sling-AROM within MD protocol- hand open/close hand, wrist flexion/extension approx 3/4 full AROM, elbow flexion/extension approx 1/2 full AROM; sensation impaired throughout; shoulder NT LUE Coordination: decreased fine motor;decreased gross motor   Lower Extremity Assessment Lower Extremity Assessment: Overall WFL for tasks assessed       Communication Communication Communication: No apparent difficulties Cueing Techniques: Verbal cues;Visual cues;Tactile cues   Cognition Arousal: Alert Behavior During Therapy: WFL for tasks assessed/performed Overall Cognitive Status: Within Functional Limits for tasks assessed                                       General Comments  incision intact pre/post session       Shoulder Instructions Shoulder Instructions Donning/doffing sling/immobilizer: Supervision/safety Correct positioning of sling/immobilizer: Supervision/safety ROM for elbow, wrist and digits of operated UE: Supervision/safety Sling wearing schedule (on at all times/off for ADL's): Supervision/safety    Home Living Family/patient expects to be discharged to:: Private residence Living Arrangements: Other relatives;Other (Comment) (grand son) Available Help at Discharge: Family;Available 24 hours/day Type of Home: House Home Access: Stairs to enter [employer] of Steps: 4 Entrance  Stairs-Rails: Can reach both Home Layout: Multi-level;Laundry or work area in Artist of Steps: flight upstairs and has a basement   Bathroom Shower/Tub: Theme park manager: Yes   Home Equipment: Hand held shower head          Prior Functioning/Environment Prior Level of Function : Independent/Modified Independent;Driving             Mobility Comments: amb with no AD ADLs Comments: 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        OT Problem List: Decreased activity tolerance;Decreased strength;Decreased range of motion      OT Treatment/Interventions: Self-care/ADL training;Balance training;DME and/or AE instruction;Therapeutic activities;Patient/family education    OT Goals(Current goals can be found in the care plan section) Acute Rehab OT Goals Patient Stated Goal: do her hair again OT Goal Formulation: With patient Time For Goal Achievement: 08/20/23 Potential to Achieve Goals: Good ADL Goals Pt Will Perform Grooming: with modified independence;sitting;standing Pt Will Perform Upper Body Dressing: with modified independence;sitting Pt Will Perform Lower Body Dressing: with modified independence;sit to/from stand Pt Will Transfer to Toilet: with modified independence;ambulating Pt Will Perform Toileting - Clothing Manipulation and hygiene: with modified independence;sit to/from stand Pt/caregiver will Perform Home Exercise Program: Left upper extremity  OT Frequency: Min 1X/week    Co-evaluation              AM-PAC OT "6 Clicks" Daily Activity     Outcome Measure Help from another person eating meals?: None Help from another person taking care of personal grooming?: None Help from another person toileting, which includes using toliet, bedpan, or urinal?: A Little Help from another person bathing (including washing,  rinsing, drying)?: A Little Help from another person to put on and  taking off regular upper body clothing?: None Help from another person to put on and taking off regular lower body clothing?: A Little 6 Click Score: 21   End of Session Equipment Utilized During Treatment: Other (comment) (sling) Nurse Communication: Mobility status  Activity Tolerance: Patient tolerated treatment well Patient left: in chair;with nursing/sitter in room;with family/visitor present  OT Visit Diagnosis: Other abnormalities of gait and mobility (R26.89)                Time: 1610-9604 OT Time Calculation (min): 32 min Charges:  OT General Charges $OT Visit: 1 Visit OT Evaluation $OT Eval Low Complexity: 1 Low  Oleta Mouse, OTD OTR/L  08/06/23, 4:13 PM

## 2023-08-06 NOTE — H&P (Signed)
History of Present Illness: Samantha Ford is a 82 y.o. who presents today for a history and physical. She is to undergo a reverse left total shoulder arthroplasty on 08/06/2023. Since her last visit here to clinic there have been no improvement in her condition. The patient expresses her desire to proceed with surgery.  She would receive intermittent steroid injections into the left shoulder by her primary care provider which she states provided substantial relief for symptoms. However, her symptoms worsened over the past few months, causing her to seek orthopedic care. The patient describes the symptoms as moderate (patient is active but has had to make modifications or give up activities) and have the quality of being aching, miserable, nagging, stabbing, tender, and throbbing. The pain is localized to the lateral arm/shoulder. These symptoms are aggravated with normal daily activities, with sleeping, carrying heavy objects, at higher levels of activity, with overhead activity, reaching behind the back, and getting dressed. She has tried acetaminophen with limited benefit. She has tried rest with temporary partial relief of her symptoms. She has tried numerous injections into her left shoulder over the years, but has not tried any formal physical therapy. She denies any neck pain, nor does she note any numbness or paresthesias down her arm to her hand.   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  Chickenpox  GERD (gastroesophageal reflux disease)  History of pneumothorax  Hyperlipidemia  Insomnia  Lactose intolerance  Osteoarthritis of bilateral knees.   Past Surgical History: KNEE ARTHROSCOPY Right 04/04/2008 (Right knee arthroscopy, partial medial and lateral meniscectomies, and chondroplasty of the medial, lateral, and patellofemoral compartments)  FRACTURE SURGERY  09/30/2010 (Right ankle surgery for fracture)  MASTECTOMY Right 12/06/2013 (Right partial mastectomy with axillary sentinel lymph node biopsy)  MASTECTOMY Right 12/29/2013 (Right mastectomy)   Past Family History: High blood pressure (Hypertension) Mother  Myocardial Infarction (Heart attack) Mother  High blood pressure (Hypertension) Father  Myocardial Infarction (Heart attack) Father   Medications: acetaminophen (TYLENOL) 500 MG tablet Take 1,000 mg by mouth at bedtime as needed for Pain  calcium carbonate-vitamin D3 (OS-CAL 500+D) 500 mg(1,250mg ) -200 unit tablet Take 1 tablet by mouth 2 (two) times daily with meals.  cholecalciferol (VITAMIN D3) 2,000 unit capsule Take 4,000 Units by mouth once daily  CYANOCOBALAMIN, VITAMIN B-12, (VITAMIN B-12 ORAL) Take by mouth once daily.  diphenhydrAMINE (BENADRYL) 25 mg tablet Take 25 mg by mouth at bedtime  ibuprofen (ADVIL,MOTRIN) 200 MG tablet Take 400 mg by mouth nightly as needed for Pain  magnesium 250 mg Tab Take 1 tablet by mouth at bedtime  multivitamin-minerals-FA-lutein (CENTRUM SILVER) 0.4 mg-300 mcg- 250 mcg tablet Take 1 tablet by mouth at bedtime  omega-3 acid ethyl esters (LOVAZA) 1 gram capsule Take 2 g by mouth 2 (two) times daily  omeprazole (PRILOSEC) 20 MG DR capsule Take 20 mg by mouth once daily.  pravastatin (PRAVACHOL) 20 MG tablet Take 1 tablet (20 mg total) by mouth once daily 90 tablet 3  sulfamethoxazole-trimethoprim (BACTRIM DS) 800-160 mg tablet Take 1 tablet by mouth 2 (two) times daily X 3 DAYS. Increase water intake while taking this medication.  traZODone (DESYREL) 50 MG tablet Take 1 tablet (50 mg total) by mouth at bedtime 90 tablet 3  vit C/E/Zn/coppr/lutein/zeaxan (PRESERVISION AREDS-2 ORAL) Take 1 capsule by mouth at bedtime   Allergies: No Known Allergies   Review of Systems: A comprehensive 14 point  ROS was performed, reviewed, and the pertinent orthopaedic findings are documented in the  HPI.  Physical Exam: BP 110/66 (BP Location: Left upper arm, Patient Position: Sitting, BP Cuff Size: Adult)  Ht 165.1 cm (5\' 5" )  Wt 76.5 kg (168 lb 9.6 oz)  BMI 28.06 kg/m   General: Well-developed well-nourished female seen in no acute distress.   HEENT: Atraumatic,normocephalic. Pupils are equal and reactive to light. Oropharynx is clear with moist mucosa  Lungs: Clear to auscultation bilaterally   Cardiovascular: Regular rate and rhythm. Normal S1, S2. No murmurs. No appreciable gallops or rubs. Peripheral pulses are palpable.  Abdomen: Soft, non-tender, nondistended. Bowel sounds present  Left shoulder exam: SKIN: normal SWELLING: none WARMTH: none LYMPH NODES: no adenopathy palpable CREPITUS: Mild glenohumeral crepitance TENDERNESS: Mildly tender along lateral acromion ROM (active):  Forward flexion: 110 degrees Abduction: 95 degrees Internal rotation: Left buttock ROM (passive):  Forward flexion: 135 degrees Abduction: 125 degrees ER/IR at 90 abd: 80 degrees / 45 degrees  She notes moderate pain at the extremes of all motions.  STRENGTH: Forward flexion: 4/5 Abduction: 4/5 External rotation: 4/5 Internal rotation: 4-4+/5 Pain with RC testing: Moderate pain with resisted abduction and mild pain with resisted forward flexion and external rotation.  Neurological: The patient is alert and oriented Sensation to light touch appears to be intact and within normal limits Gross motor strength appeared to be equal to 5/5  Vascular : Peripheral pulses felt to be palpable. Capillary refill appears to be intact and within normal limits  X-ray Imaging: Plain x-rays taken of the left shoulder demonstrates moderate degenerative changes as manifested by joint space narrowing and osteophyte formation off of the inferior humeral head. This is subacromial space is markedly decreased. There is no subchondral or infraclavicular spurring. She does demonstrate a type II  acromion  Left Shoulder Imaging, MRI: MRI Shoulder Cartilage: Partial thickness humeral head cartilage loss. Partial thickness glenoid cartilage loss. MRI Shoulder Rotator Cuff: Full thickness tear of the supraspinatus and infraspinatus tendons. Retracted to the glenohumeral joint. MRI Shoulder Labrum / Biceps: Biceps tendinopathy. MRI Shoulder Bone: Normal bone.  Impression: 1. Nontraumatic complete tear of left rotator cuff 2. Degenerative arthrosis left shoulder  Plan: The treatment options were discussed with the patient. In addition, patient educational materials were provided regarding the diagnosis and treatment options. The patient is quite frustrated by her symptoms and functional limitations, and is ready to consider more aggressive treatment options. Therefore, I have recommended a surgical procedure, specifically a reverse left total shoulder arthroplasty. The procedure was discussed with the patient, as were the potential risks (including bleeding, infection, nerve and/or blood vessel injury, persistent or recurrent pain, loosening and/or failure of the components, dislocation, need for further surgery, blood clots, strokes, heart attacks and/or arhythmias, pneumonia, etc.) and benefits. The patient states her understanding and wishes to proceed. All of the patient's questions and concerns were answered. She can call any time with further concerns. She will follow up post-surgery, routine.     H&P reviewed and patient re-examined. No changes.

## 2023-08-06 NOTE — Discharge Instructions (Addendum)
Orthopedic discharge instructions: May shower with intact OpSite dressing once nerve block has worn off (around Monday).  Apply ice frequently to shoulder or use Polar Care device. Take hydrocodone as prescribed or ES Tylenol if necessary. Keep shoulder immobilizer on at all times except may remove for bathing purposes. Follow-up in 10-14 days or as scheduled.  AMBULATORY SURGERY  DISCHARGE INSTRUCTIONS   The drugs that you were given will stay in your system until tomorrow so for the next 24 hours you should not:  Drive an automobile Make any legal decisions Drink any alcoholic beverage   You may resume regular meals tomorrow.  Today it is better to start with liquids and gradually work up to solid foods.  You may eat anything you prefer, but it is better to start with liquids, then soup and crackers, and gradually work up to solid foods.   Please notify your doctor immediately if you have any unusual bleeding, trouble breathing, redness and pain at the surgery site, drainage, fever, or pain not relieved by medication.    Additional Instructions: PLEASE LEAVE EXPARE (TEAL) ARMBAND ON FOR 4 DAYS    Please contact your physician with any problems or Same Day Surgery at [phone], Monday through Friday 6 am to 4 pm, or [place] [facility] number at [phone].  POLAR CARE INFORMATION  MassAdvertisement.it  How to use Breg Polar Care [facility] Therapy System?  YouTube   ShippingScam.co.uk  OPERATING INSTRUCTIONS  Start the product With dry hands, connect the transformer to the electrical connection located on the top of the cooler. Next, plug the transformer into an appropriate electrical outlet. The unit will automatically start running at this point.  To stop the pump, disconnect electrical power.  Unplug to stop the product when not in use. Unplugging the Polar Care unit turns it off. Always unplug immediately after use. Never leave it  plugged in while unattended. Remove pad.    FIRST ADD WATER TO FILL LINE, THEN ICE---Replace ice when existing ice is almost melted  1 Discuss Treatment with your Licensed Health Care Practitioner and Use Only as Prescribed 2 Apply Insulation Barrier & Cold Therapy Pad 3 Check for Moisture 4 Inspect Skin Regularly  Tips and Trouble Shooting Usage Tips 1. Use cubed or chunked ice for optimal performance. 2. It is recommended to drain the Pad between uses. To drain the pad, hold the Pad upright with the hose pointed toward the ground. Depress the black plunger and allow water to drain out. 3. You may disconnect the Pad from the unit without removing the pad from the affected area by depressing the silver tabs on the hose coupling and gently pulling the hoses apart. The Pad and unit will seal itself and will not leak. Note: Some dripping during release is normal. 4. DO NOT RUN PUMP WITHOUT WATER! The pump in this unit is designed to run with water. Running the unit without water will cause permanent damage to the pump. 5. Unplug unit before removing lid.  TROUBLESHOOTING GUIDE Pump not running, Water not flowing to the pad, Pad is not getting cold 1. Make sure the transformer is plugged into the wall outlet. 2. Confirm that the ice and water are filled to the indicated levels. 3. Make sure there are no kinks in the pad. 4. Gently pull on the blue tube to make sure the tube/pad junction is straight. 5. Remove the pad from the treatment site and ll it while the pad is lying at;  then reapply. 6. Confirm that the pad couplings are securely attached to the unit. Listen for the double clicks (Figure 1) to confirm the pad couplings are securely attached.  Leaks    Note: Some condensation on the lines, controller, and pads is unavoidable, especially in warmer climates. 1. If using a Breg Polar Care Cold Therapy unit with a detachable Cold Therapy Pad, and a leak exists (other than condensation on the  lines) disconnect the pad couplings. Make sure the silver tabs on the couplings are depressed before reconnecting the pad to the pump hose; then confirm both sides of the coupling are properly clicked in. 2. If the coupling continues to leak or a leak is detected in the pad itself, stop using it and call [employer] Customer Care at [phone].  Cleaning After use, empty and dry the unit with a soft cloth. Warm water and mild detergent may be used occasionally to clean the pump and tubes.  WARNING: The Polar Care Cube can be cold enough to cause serious injury, including full skin necrosis. Follow these Operating Instructions, and carefully read the Product Insert (see pouch on side of unit) and the Cold Therapy Pad Fitting Instructions (provided with each Cold Therapy Pad) prior to use.   SHOULDER SLING IMMOBILIZER   VIDEO Slingshot 2 Shoulder Brace Application - YouTube ---https://www.porter.info/  INSTRUCTIONS While supporting the injured arm, slide the forearm into the sling. Wrap the adjustable shoulder strap around the neck and shoulders and attach the strap end to the sling using  the "alligator strap tab."  Adjust the shoulder strap to the required length. Position the shoulder pad behind the neck. To secure the shoulder pad location (optional), pull the shoulder strap away from the shoulder pad, unfold the hook material on the top of the pad, then press the shoulder strap back onto the hook material to secure the pad in place. Attach the closure strap across the open top of the sling. Position the strap so that it holds the arm securely in the sling. Next, attach the thumb strap to the open end of the sling between the thumb and fingers. After sling has been fit, it may be easily removed and reapplied using the quick release buckle on shoulder strap. If a neutral pillow or 15 abduction pillow is included, place the pillow at the waistline. Attach the sling to the  pillow, lining up hook material on the pillow with the loop on sling. Adjust the waist strap to fit.  If waist strap is too long, cut it to fit. Use the small piece of double sided hook material (located on top of the pillow) to secure the strap end. Place the double sided hook material on the inside of the cut strap end and secure it to the waist strap.     If no pillow is included, attach the waist strap to the sling and adjust to fit.    Washing Instructions: Straps and sling must be removed and cleaned regularly depending on your activity level and perspiration. Hand wash straps and sling in cold water with mild detergent, rinse, air dry        Interscalene Nerve Block with Exparel   For your surgery you have received an Interscalene Nerve Block with Exparel. Nerve Blocks affect many types of nerves, including nerves that control movement, pain and normal sensation.  You may experience feelings such as numbness, tingling, heaviness, weakness or the inability to move your arm or the feeling or sensation that  your arm has "fallen asleep". A nerve block with Exparel can last up to 5 days.  Usually the weakness wears off first.  The tingling and heaviness usually wear off next.  Finally you may start to notice pain.  Keep in mind that this may occur in any order.  Once a nerve block starts to wear off it is usually completely gone within 60 minutes. ISNB may cause mild shortness of breath, a hoarse voice, blurry vision, unequal pupils, or drooping of the face on the same side as the nerve block.  These symptoms will usually resolve with the numbness.  Very rarely the procedure itself can cause mild seizures. If needed, your surgeon will give you a prescription for pain medication.  It will take about 60 minutes for the oral pain medication to become fully effective.  So, it is recommended that you start taking this medication before the nerve block first begins to wear off, or when you first begin to  feel discomfort. Take your pain medication only as prescribed.  Pain medication can cause sedation and decrease your breathing if you take more than you need for the level of pain that you have. Nausea is a common side effect of many pain medications.  You may want to eat something before taking your pain medicine to prevent nausea. After an Interscalene nerve block, you cannot feel pain, pressure or extremes in temperature in the effected arm.  Because your arm is numb it is at an increased risk for injury.  To decrease the possibility of injury, please practice the following:  While you are awake change the position of your arm frequently to prevent too much pressure on any one area for prolonged periods of time.  If you have a cast or tight dressing, check the color or your fingers every couple of hours.  Call your surgeon with the appearance of any discoloration (white or blue). If you are given a sling to wear before you go home, please wear it  at all times until the block has completely worn off.  Do not get up at night without your sling. Please contact [facility] or your surgeon if you do not begin to regain sensation after 7 days from the surgery.  Anesthesia may be contacted by calling the Same Day Surgery Department, Mon. through Fri., 6 am to 4 pm at [phone].   If you experience any other problems or concerns, please contact your surgeon's office. If you experience severe or prolonged shortness of breath go to the nearest emergency department.

## 2023-08-06 NOTE — Transfer of Care (Signed)
Immediate Anesthesia Transfer of Care Note  Patient: Samantha Ford  Procedure(s) Performed: REVERSE SHOULDER ARTHROPLASTY WITH BICEPS TENODESIS. (Left: Shoulder)  Patient Location: PACU  Anesthesia Type:General  Level of Consciousness: drowsy  Airway & Oxygen Therapy: Patient Spontanous Breathing and Patient connected to face mask oxygen  Post-op Assessment: Report given to RN and Post -op Vital signs reviewed and stable  Post vital signs: Reviewed and stable  Last Vitals:  Vitals Value Taken Time  BP 141/61 08/06/23 1245  Temp 36.2 C 08/06/23 1241  Pulse 74 08/06/23 1247  Resp 14 08/06/23 1247  SpO2 99 % 08/06/23 1247  Vitals shown include unfiled device data.  Last Pain:  Vitals:   08/06/23 0848  TempSrc: Oral  PainSc: 0-No pain         Complications: No notable events documented.

## 2023-08-07 ENCOUNTER — Encounter: Payer: Self-pay | Admitting: Surgery

## 2023-08-10 NOTE — Anesthesia Postprocedure Evaluation (Signed)
Anesthesia Post Note  Patient: Raelene Bott  Procedure(s) Performed: REVERSE SHOULDER ARTHROPLASTY WITH BICEPS TENODESIS. (Left: Shoulder)  Patient location during evaluation: PACU Anesthesia Type: General Level of consciousness: awake and alert Pain management: pain level controlled Vital Signs Assessment: post-procedure vital signs reviewed and stable Respiratory status: spontaneous breathing, nonlabored ventilation, respiratory function stable and patient connected to nasal cannula oxygen Cardiovascular status: blood pressure returned to baseline and stable Postop Assessment: no apparent nausea or vomiting Anesthetic complications: no   No notable events documented.   Last Vitals:  Vitals:   08/06/23 1346 08/06/23 1644  BP:  117/65  Pulse:  64  Resp:  18  Temp:    SpO2: 97% 96%    Last Pain:  Vitals:   08/07/23 1354  TempSrc:   PainSc: 0-No pain                 Yevette Edwards

## 2024-05-13 ENCOUNTER — Emergency Department (HOSPITAL_COMMUNITY)
Admission: EM | Admit: 2024-05-13 | Discharge: 2024-05-13 | Disposition: A | Discharge status: Home / Self Care | Attending: Student | Admitting: Student

## 2024-05-13 ENCOUNTER — Emergency Department (HOSPITAL_COMMUNITY)

## 2024-05-13 ENCOUNTER — Other Ambulatory Visit: Payer: Self-pay

## 2024-05-13 ENCOUNTER — Encounter (HOSPITAL_COMMUNITY): Payer: Self-pay

## 2024-05-13 DIAGNOSIS — I10 Essential (primary) hypertension: Secondary | ICD-10-CM | POA: Diagnosis not present

## 2024-05-13 DIAGNOSIS — M545 Low back pain, unspecified: Secondary | ICD-10-CM

## 2024-05-13 DIAGNOSIS — Z79899 Other long term (current) drug therapy: Secondary | ICD-10-CM | POA: Insufficient documentation

## 2024-05-13 DIAGNOSIS — Z853 Personal history of malignant neoplasm of breast: Secondary | ICD-10-CM | POA: Insufficient documentation

## 2024-05-13 MED ORDER — ACETAMINOPHEN 500 MG PO TABS: 1000.0000 mg | ORAL_TABLET | Freq: Three times a day (TID) | ORAL | 0 refills | Status: AC

## 2024-05-13 MED ORDER — KETOROLAC TROMETHAMINE 15 MG/ML IJ SOLN
15.0000 mg | Freq: Once | INTRAMUSCULAR | Status: AC
  Administered 2024-05-13: 15 mg via INTRAMUSCULAR
  Filled 2024-05-13: qty 1

## 2024-05-13 MED ORDER — OXYCODONE HCL 5 MG PO TABS: 5.0000 mg | ORAL_TABLET | ORAL | 0 refills | Status: DC | PRN

## 2024-05-13 MED ORDER — LIDOCAINE 5 % EX PTCH
1.0000 | MEDICATED_PATCH | CUTANEOUS | Status: DC
  Administered 2024-05-13: 1 via TRANSDERMAL
  Filled 2024-05-13: qty 1

## 2024-05-13 MED ORDER — HYDROCODONE-ACETAMINOPHEN 5-325 MG PO TABS
1.0000 | ORAL_TABLET | Freq: Once | ORAL | Status: AC
  Administered 2024-05-13: 1 via ORAL
  Filled 2024-05-13: qty 1

## 2024-05-13 MED ORDER — NAPROXEN 375 MG PO TABS: 375.0000 mg | ORAL_TABLET | Freq: Two times a day (BID) | ORAL | 0 refills | Status: DC

## 2024-05-13 MED ORDER — LIDOCAINE 5 % EX PTCH: 1.0000 | MEDICATED_PATCH | CUTANEOUS | 0 refills | Status: AC

## 2024-05-13 NOTE — Discharge Instructions (Addendum)
 For pain:  - Acetaminophen 1000 mg three times daily (every 8 hours) - Naproxen 2 times daily (every 12 hours) - oxycodone for breakthrough pain only

## 2024-05-13 NOTE — ED Notes (Incomplete)
Pt teaching provided on medications that may cause drowsiness. Pt instructed not to drive or operate heavy machinery while taking the prescribed medication. Pt verbalized understanding.   Pt provided discharge instructions and prescription information. Pt was given the opportunity to ask questions and questions were answered.   

## 2024-05-13 NOTE — ED Triage Notes (Signed)
 Pt stated that she has been having back pain since yesterday and today she can't hardly walk.

## 2024-05-14 NOTE — ED Provider Notes (Signed)
 [place] EMERGENCY DEPARTMENT AT [facility] Provider Note  CSN: [id number] Arrival date & time: 05/13/24 1832  Chief Complaint(s) Back Pain  HPI Samantha Ford is a 83 y.o. female with PMH of breast cancer status postmastectomy, GERD, HTN, HLD who presents emergency room for evaluation of low back pain.  States that pain has been progressively worsening over the last 24 hours and she is having difficulty walking due to the pain.  Denies trauma to the back.  Denies saddle anesthesia, involuntary loss of bowel or bladder, numbness, tingling, weakness or other red flag signs of back pain.  No recent documentation in the spine or IV drug use.  Denies fevers.  Denies dysuria, chest pain, shortness of breath, abdominal pain, nausea, vomiting or other systemic symptoms.   Past Medical History Past Medical History:  Diagnosis Date   Breast cancer (HCC) 10/22/2013   Mastectomy in 2015   Cancer [facility])    right breast 12/06/2013   GERD (gastroesophageal reflux disease)    Hyperlipidemia    Insomnia    Macular degeneration of right eye    Osteoarthritis    Pneumonia    Pneumothorax    There are no active problems to display for this patient.  Home Medication(s) Prior to Admission medications   Medication Sig Start Date End Date Taking? Authorizing Provider  acetaminophen  (TYLENOL ) 500 MG tablet Take 2 tablets (1,000 mg total) by mouth every 8 (eight) hours. 05/13/24 06/12/24 Yes Axle Parfait, MD  lidocaine  (LIDODERM ) 5 % Place 1 patch onto the skin daily. Remove & Discard patch within 12 hours or as directed by MD 05/13/24  Yes Mickey Esguerra, MD  naproxen (NAPROSYN) 375 MG tablet Take 1 tablet (375 mg total) by mouth 2 (two) times daily. 05/13/24  Yes Marietta Sikkema, MD  oxyCODONE  (ROXICODONE ) 5 MG immediate release tablet Take 1 tablet (5 mg total) by mouth every 4 (four) hours as needed for breakthrough pain. 05/13/24  Yes Manreet Kiernan, MD  acetaminophen  (TYLENOL ) 500 MG  tablet Take 1,000 mg by mouth at bedtime as needed.    [provider]  cetirizine (ZYRTEC) 10 MG tablet Take 10 mg by mouth daily.    [provider]  Cholecalciferol (VITAMIN D3) 50 MCG CAPS Take 1 capsule by mouth at bedtime.    [provider]  diphenhydrAMINE (BENADRYL) 25 MG tablet Take 25 mg by mouth at bedtime.    [provider]  HYDROcodone -acetaminophen  (NORCO/VICODIN) 5-325 MG tablet Take 1-2 tablets by mouth every 6 (six) hours as needed for moderate pain or severe pain. 08/06/23 08/05/24  Poggi, Kaylene Pascal, MD  Magnesium 250 MG TABS Take 1 tablet by mouth at bedtime.    [provider]  Methylcobalamin 500 MCG CHEW Chew 2 capsules by mouth at bedtime.    [provider]  Multiple Vitamins-Minerals (MULTIVITAMIN ADULTS 50+ PO) Take 1 tablet by mouth at bedtime.    [provider]  Multiple Vitamins-Minerals (PRESERVISION AREDS 2) CAPS Take 1 capsule by mouth at bedtime.    [provider]  Omega-3 Fatty Acids (OMEGA-3 FISH OIL) 1200 MG CAPS Take 1 capsule by mouth at bedtime.    [provider]  omeprazole (PRILOSEC) 20 MG capsule Take 20 mg by mouth daily.    [provider]  pravastatin (PRAVACHOL) 20 MG tablet Take 20 mg by mouth at bedtime.    [provider]  traZODone (DESYREL) 50 MG tablet Take 50 mg by mouth at bedtime.  [provider]                                                                                                                                    Past Surgical History Past Surgical History:  Procedure Laterality Date   ANKLE FRACTURE SURGERY Right 09/30/2010   BREAST BIOPSY Left 11/29/2015   neg, ribbon clip   BREAST BIOPSY Left 11/29/2015   neg, coil clip    KNEE ARTHROSCOPY Right 04/04/2008   MASTECTOMY Right 12/29/2013   PARTIAL MASTECTOMY WITH AXILLARY SENTINEL LYMPH NODE BIOPSY Right 12/06/2013   REVERSE SHOULDER ARTHROPLASTY Left 08/06/2023    Procedure: REVERSE SHOULDER ARTHROPLASTY WITH BICEPS TENODESIS.;  Surgeon: Elner Hahn, MD;  Location: [facility] ORS;  Service: Orthopedics;  Laterality: Left;   Family History Family History  Problem Relation Age of Onset   Breast cancer Neg Hx     Social History Social History   Tobacco Use   Smoking status: Never   Smokeless tobacco: Never  Vaping Use   Vaping status: Never Used  Substance Use Topics   Alcohol use: Not Currently   Drug use: Never   Allergies Patient has no known allergies.  Review of Systems Review of Systems  Musculoskeletal:  Positive for back pain.    Physical Exam Vital Signs  I have reviewed the triage vital signs BP 125/71   Pulse 64   Temp 97.8 F (36.6 C) (Oral)   Resp 20   Ht 5\' 6"  (1.676 m)   Wt 70.8 kg   SpO2 94%   BMI 25.18 kg/m  \ Physical Exam Vitals and nursing note reviewed.  Constitutional:      General: She is not in acute distress.    Appearance: She is well-developed.  HENT:     Head: Normocephalic and atraumatic.  Eyes:     Conjunctiva/sclera: Conjunctivae normal.  Cardiovascular:     Rate and Rhythm: Normal rate and regular rhythm.     Heart sounds: No murmur heard. Pulmonary:     Effort: Pulmonary effort is normal. No respiratory distress.     Breath sounds: Normal breath sounds.  Abdominal:     Palpations: Abdomen is soft.     Tenderness: There is no abdominal tenderness.  Musculoskeletal:        General: Tenderness present. No swelling.     Cervical back: Neck supple.  Skin:    General: Skin is warm and dry.     Capillary Refill: Capillary refill takes less than 2 seconds.  Neurological:     Mental Status: She is alert.  Psychiatric:        Mood and Affect: Mood normal.     ED Results and Treatments Labs (all labs ordered are listed, but only abnormal results are displayed) Labs Reviewed - No data to display  Radiology CT Lumbar Spine Wo Contrast Result Date: 05/13/2024 EXAM: CT OF THE LUMBAR SPINE WITHOUT CONTRAST 05/13/2024 08:37:46 PM TECHNIQUE: CT of the lumbar spine was performed without the administration of intravenous contrast. Multiplanar reformatted images are provided for review. Automated exposure control, iterative reconstruction, and/or weight based adjustment of the mA/kV was utilized to reduce the radiation dose to as low as reasonably achievable. COMPARISON: CT abdomen and pelvis without contrast 10/05/2008. CLINICAL HISTORY: Low back pain, cauda equina syndrome suspected. Pt stated that she has been having back pain since yesterday and today she can't hardly walk. FINDINGS: BONES AND ALIGNMENT: There is straightening out of the normal lumbar lordosis. An inferior endplate Schmorl's node is present at L2 on the left. No osseous destructive lesion is seen. DEGENERATIVE CHANGES: At L1-2, asymmetrical facet hypertrophy is present. No significant disc protrusion or stenosis is present. At L2-3, a leftward disc protrusion is present. Mild left subarticular and foraminal stenosis is present. At L3-4, mild disc degeneration and facet hypertrophy are present. No significant stenosis is present. At L4-5, a leftward disc protrusion is present. Mild left subarticular and moderate left foraminal stenosis are present. Mild right-sided facet hypertrophy is secondary to asymmetric facet spurring. At L5-S1, chronic loss of disc height and endplate osteophytes results in mild foraminal stenosis bilaterally. Mild facet hypertrophy is worse on the right. SOFT TISSUES: A large adult hernia is present. LIMITED RETROPERITONEUM: Atherosclerotic calcifications are present in the aorta and branch vessels without aneurysm. IMPRESSION: 1. Leftward disc protrusion at L2-3 with mild left subarticular and foraminal stenosis. 2. Leftward disc protrusion at L4-5 with mild left subarticular and moderate left  foraminal stenosis. 3. Chronic loss of disc height and endplate osteophytes at L5-S1 resulting in mild foraminal stenosis bilaterally. Mild facet hypertrophy is worse on the right. Electronically signed by: Audree Leas MD 05/13/2024 09:04 PM EDT RP Workstation: [id number]    Pertinent labs & imaging results that were available during my care of the patient were reviewed by me and considered in my medical decision making (see MDM for details).  Medications Ordered in ED Medications  lidocaine  (LIDODERM ) 5 % 1 patch (1 patch Transdermal Patch Applied 05/13/24 2049)  HYDROcodone -acetaminophen  (NORCO/VICODIN) 5-325 MG per tablet 1 tablet (1 tablet Oral Given 05/13/24 2049)  ketorolac (TORADOL) 15 MG/ML injection 15 mg (15 mg Intramuscular Given 05/13/24 2050)                                                                                                                                     Procedures Procedures  (including critical care time)  Medical Decision Making / ED Course   This patient presents to the ED for concern of back pain, this involves an extensive number of treatment options, and is a complaint that carries with it a high risk of complications and morbidity.  The differential diagnosis includes muscular strain/spasm, lumbago, disc herniation, spinal fracture, cauda equina, epidural abscess  or hematoma, psoas abscess, pyelonephritis,  MDM: Patient seen emergency room for evaluation of back pain.  Physical exam with tenderness in the L-spine but is otherwise unremarkable.  Neurologic exam is reassuringly unremarkable with no focal motor or sensory deficits.  No saddle anesthesia.  CT L-spine showing a disc protrusion at L2-L3, L4-L5 and chronic loss of disc height and osteophytes at L5-S1.  Patient to control with Toradol, Norco and Lidoderm  and on reevaluation she is able to ambulate without difficulty.  Will attempt to avoid steroids given patient's high risk of osteoporosis.   May benefit from local steroid injections and patient given resources to follow-up outpatient with neurosurgery.  At this time she does not meet inpatient criteria for admission and will be discharged with outpatient follow-up.   Additional history obtained: -Additional history obtained from son -External records from outside source obtained and reviewed including: Chart review including previous notes, labs, imaging, consultation notes    Imaging Studies ordered: I ordered imaging studies including CT L-spine I independently visualized and interpreted imaging. I agree with the radiologist interpretation   Medicines ordered and prescription drug management: Meds ordered this encounter  Medications   HYDROcodone -acetaminophen  (NORCO/VICODIN) 5-325 MG per tablet 1 tablet    Refill:  0   ketorolac (TORADOL) 15 MG/ML injection 15 mg   lidocaine  (LIDODERM ) 5 % 1 patch   naproxen (NAPROSYN) 375 MG tablet    Sig: Take 1 tablet (375 mg total) by mouth 2 (two) times daily.    Dispense:  20 tablet    Refill:  0   acetaminophen  (TYLENOL ) 500 MG tablet    Sig: Take 2 tablets (1,000 mg total) by mouth every 8 (eight) hours.    Dispense:  180 tablet    Refill:  0   oxyCODONE  (ROXICODONE ) 5 MG immediate release tablet    Sig: Take 1 tablet (5 mg total) by mouth every 4 (four) hours as needed for breakthrough pain.    Dispense:  10 tablet    Refill:  0   lidocaine  (LIDODERM ) 5 %    Sig: Place 1 patch onto the skin daily. Remove & Discard patch within 12 hours or as directed by MD    Dispense:  30 patch    Refill:  0    -I have reviewed the patients home medicines and have made adjustments as needed  Critical interventions none  Social Determinants of Health:  Factors impacting patients care include: none   Reevaluation: After the interventions noted above, I reevaluated the patient and found that they have :improved  Co morbidities that complicate the patient evaluation  Past  Medical History:  Diagnosis Date   Breast cancer (HCC) 10/22/2013   Mastectomy in 2015   Cancer [facility])    right breast 12/06/2013   GERD (gastroesophageal reflux disease)    Hyperlipidemia    Insomnia    Macular degeneration of right eye    Osteoarthritis    Pneumonia    Pneumothorax       Dispostion: I considered admission for this patient, but at this time she does not meet inpatient criteria for admission and will be discharged with outpatient follow-up     Final Clinical Impression(s) / ED Diagnoses Final diagnoses:  Acute right-sided low back pain without sciatica     @PCDICTATION @    Karlyn Overman, MD 05/14/24 [phone]

## 2024-05-15 ENCOUNTER — Telehealth (HOSPITAL_COMMUNITY): Payer: Self-pay | Admitting: Emergency Medicine

## 2024-05-15 MED ORDER — OXYCODONE HCL 5 MG PO TABS
5.0000 mg | ORAL_TABLET | Freq: Four times a day (QID) | ORAL | 0 refills | Status: DC | PRN
Start: 2024-05-15 — End: 2024-06-02

## 2024-05-15 NOTE — Telephone Encounter (Cosign Needed)
 Patient was unable to get prescription filled at pharmacy due to low quantity.  Pharmacy did call and recommend sending the prescription to a different pharmacy.

## 2024-05-19 ENCOUNTER — Emergency Department

## 2024-05-19 ENCOUNTER — Emergency Department
Admission: EM | Admit: 2024-05-19 | Discharge: 2024-05-19 | Disposition: A | Discharge status: Home / Self Care | Attending: Emergency Medicine | Admitting: Emergency Medicine

## 2024-05-19 ENCOUNTER — Encounter: Payer: Self-pay | Admitting: Emergency Medicine

## 2024-05-19 ENCOUNTER — Other Ambulatory Visit: Payer: Self-pay

## 2024-05-19 DIAGNOSIS — X58XXXA Exposure to other specified factors, initial encounter: Secondary | ICD-10-CM | POA: Insufficient documentation

## 2024-05-19 DIAGNOSIS — Z853 Personal history of malignant neoplasm of breast: Secondary | ICD-10-CM | POA: Diagnosis not present

## 2024-05-19 DIAGNOSIS — S32010A Wedge compression fracture of first lumbar vertebra, initial encounter for closed fracture: Secondary | ICD-10-CM

## 2024-05-19 DIAGNOSIS — R630 Anorexia: Secondary | ICD-10-CM | POA: Diagnosis not present

## 2024-05-19 DIAGNOSIS — S3992XA Unspecified injury of lower back, initial encounter: Secondary | ICD-10-CM | POA: Diagnosis present

## 2024-05-19 DIAGNOSIS — Z96612 Presence of left artificial shoulder joint: Secondary | ICD-10-CM | POA: Insufficient documentation

## 2024-05-19 DIAGNOSIS — S32019A Unspecified fracture of first lumbar vertebra, initial encounter for closed fracture: Secondary | ICD-10-CM | POA: Diagnosis not present

## 2024-05-19 DIAGNOSIS — R1032 Left lower quadrant pain: Secondary | ICD-10-CM | POA: Diagnosis not present

## 2024-05-19 LAB — URINALYSIS, ROUTINE W REFLEX MICROSCOPIC
Bilirubin Urine: NEGATIVE
Glucose, UA: NEGATIVE mg/dL
Hgb urine dipstick: NEGATIVE
Ketones, ur: 20 mg/dL — AB
Nitrite: NEGATIVE
Protein, ur: NEGATIVE mg/dL
Specific Gravity, Urine: 1.004 — ABNORMAL LOW (ref 1.005–1.030)
pH: 6 (ref 5.0–8.0)

## 2024-05-19 LAB — COMPREHENSIVE METABOLIC PANEL WITH GFR
ALT: 14 U/L (ref 0–44)
AST: 21 U/L (ref 15–41)
Albumin: 3.4 g/dL — ABNORMAL LOW (ref 3.5–5.0)
Alkaline Phosphatase: 70 U/L (ref 38–126)
Anion gap: 9 (ref 5–15)
BUN: 8 mg/dL (ref 8–23)
CO2: 29 mmol/L (ref 22–32)
Calcium: 9.3 mg/dL (ref 8.9–10.3)
Chloride: 95 mmol/L — ABNORMAL LOW (ref 98–111)
Creatinine, Ser: 0.77 mg/dL (ref 0.44–1.00)
GFR, Estimated: >60 mL/min (ref >60)
Glucose, Bld: 111 mg/dL — ABNORMAL HIGH (ref 70–99)
Potassium: 3.3 mmol/L — ABNORMAL LOW (ref 3.5–5.1)
Sodium: 133 mmol/L — ABNORMAL LOW (ref 135–145)
Total Bilirubin: 1.2 mg/dL (ref 0.0–1.2)
Total Protein: 7 g/dL (ref 6.5–8.1)

## 2024-05-19 LAB — CBC WITH DIFFERENTIAL/PLATELET
Abs Immature Granulocytes: 0.03 10*3/uL (ref 0.00–0.07)
Basophils Absolute: 0 10*3/uL (ref 0.0–0.1)
Basophils Relative: 1 %
Eosinophils Absolute: 0.2 10*3/uL (ref 0.0–0.5)
Eosinophils Relative: 5 %
HCT: 38.8 % (ref 36.0–46.0)
Hemoglobin: 12.5 g/dL (ref 12.0–15.0)
Immature Granulocytes: 1 %
Lymphocytes Relative: 25 %
Lymphs Abs: 1.2 10*3/uL (ref 0.7–4.0)
MCH: 27.5 pg (ref 26.0–34.0)
MCHC: 32.2 g/dL (ref 30.0–36.0)
MCV: 85.3 fL (ref 80.0–100.0)
Monocytes Absolute: 0.4 10*3/uL (ref 0.1–1.0)
Monocytes Relative: 9 %
Neutro Abs: 2.8 10*3/uL (ref 1.7–7.7)
Neutrophils Relative %: 59 %
Platelets: 213 10*3/uL (ref 150–400)
RBC: 4.55 MIL/uL (ref 3.87–5.11)
RDW: 13.2 % (ref 11.5–15.5)
WBC: 4.6 10*3/uL (ref 4.0–10.5)
nRBC: 0 % (ref 0.0–0.2)

## 2024-05-19 LAB — LIPASE, BLOOD: Lipase: 31 U/L (ref 11–51)

## 2024-05-19 MED ORDER — OXYCODONE HCL 5 MG PO TABS: 5.0000 mg | ORAL_TABLET | ORAL | 0 refills | Status: AC | PRN

## 2024-05-19 MED ORDER — MORPHINE SULFATE (PF) 2 MG/ML IV SOLN
2.0000 mg | Freq: Once | INTRAVENOUS | Status: AC
  Administered 2024-05-19: 2 mg via INTRAVENOUS
  Filled 2024-05-19: qty 1

## 2024-05-19 MED ORDER — OXYCODONE-ACETAMINOPHEN 5-325 MG PO TABS
1.0000 | ORAL_TABLET | Freq: Once | ORAL | Status: AC
  Administered 2024-05-19: 1 via ORAL
  Filled 2024-05-19: qty 1

## 2024-05-19 MED ORDER — KETOROLAC TROMETHAMINE 15 MG/ML IJ SOLN
15.0000 mg | Freq: Once | INTRAMUSCULAR | Status: AC
  Administered 2024-05-19: 15 mg via INTRAVENOUS
  Filled 2024-05-19: qty 1

## 2024-05-19 MED ORDER — ACETAMINOPHEN 325 MG PO TABS
650.0000 mg | ORAL_TABLET | Freq: Once | ORAL | Status: AC
  Administered 2024-05-19: 650 mg via ORAL
  Filled 2024-05-19: qty 2

## 2024-05-19 MED ORDER — ONDANSETRON 4 MG PO TBDP: 4.0000 mg | ORAL_TABLET | Freq: Three times a day (TID) | ORAL | 0 refills | Status: DC | PRN

## 2024-05-19 MED ORDER — PANTOPRAZOLE SODIUM 40 MG IV SOLR
40.0000 mg | Freq: Once | INTRAVENOUS | Status: AC
  Administered 2024-05-19: 40 mg via INTRAVENOUS
  Filled 2024-05-19: qty 10

## 2024-05-19 MED ORDER — ONDANSETRON HCL 4 MG/2ML IJ SOLN
4.0000 mg | Freq: Once | INTRAMUSCULAR | Status: AC
  Administered 2024-05-19: 4 mg via INTRAVENOUS
  Filled 2024-05-19: qty 2

## 2024-05-19 MED ORDER — SODIUM CHLORIDE 0.9 % IV BOLUS
500.0000 mL | Freq: Once | INTRAVENOUS | Status: AC
  Administered 2024-05-19: 500 mL via INTRAVENOUS

## 2024-05-19 MED ORDER — IOHEXOL 300 MG/ML  SOLN
100.0000 mL | Freq: Once | INTRAMUSCULAR | Status: AC | PRN
  Administered 2024-05-19: 100 mL via INTRAVENOUS

## 2024-05-19 NOTE — ED Notes (Signed)
 Pt walked around nursing station without any issues.

## 2024-05-19 NOTE — ED Provider Notes (Signed)
 [facility] Provider Note    Event Date/Time   First MD Initiated Contact with Patient 05/19/24 [phone]     (approximate)   History   Chief Complaint: Abdominal Pain   HPI  Samantha Ford is a 83 y.o. female with a history of GERD, kidney stones, diverticulitis who comes to the ED complaining of left lower quadrant abdominal pain for the past week.  Initially started in the right flank, went to the ED 1 week ago, was diagnosed with musculoskeletal pain and started on NSAIDs, Lidoderm , oxycodone .  Since then she has had constipation issues which are improved with MiraLAX.  Reports a loss of appetite and some scant vomiting over the last few days.  No fever.  Pain is migrated to her left lower quadrant and is severe.  Radiates to her back.  She has been on antibiotics over the past 3 weeks as well for diverticulitis, currently taking metronidazole.        Past Medical History:  Diagnosis Date   Breast cancer (HCC) 10/22/2013   Mastectomy in 2015   Cancer [facility])    right breast 12/06/2013   GERD (gastroesophageal reflux disease)    Hyperlipidemia    Insomnia    Macular degeneration of right eye    Osteoarthritis    Pneumonia    Pneumothorax     Current Outpatient Rx   Order #: [id number] Class: Normal   Order #: [id number] Class: Normal   Order #: [id number] Class: Historical Med   Order #: [id number] Class: Normal   Order #: [id number] Class: Historical Med   Order #: [id number] Class: Historical Med   Order #: [id number] Class: Historical Med   Order #: [id number] Class: Print   Order #: [id number] Class: Normal   Order #: [id number] Class: Historical Med   Order #: [id number] Class: Historical Med   Order #: [id number] Class: Historical Med   Order #: [id number] Class: Historical Med   Order #: [id number] Class: Normal   Order #: [id number] Class: Historical Med   Order #: [id number] Class: Historical Med   Order #: [id number] Class: Normal   Order #: [id number] Class:  Historical Med   Order #: [id number] Class: Historical Med    Past Surgical History:  Procedure Laterality Date   ANKLE FRACTURE SURGERY Right 09/30/2010   BREAST BIOPSY Left 11/29/2015   neg, ribbon clip   BREAST BIOPSY Left 11/29/2015   neg, coil clip    KNEE ARTHROSCOPY Right 04/04/2008   MASTECTOMY Right 12/29/2013   PARTIAL MASTECTOMY WITH AXILLARY SENTINEL LYMPH NODE BIOPSY Right 12/06/2013   REVERSE SHOULDER ARTHROPLASTY Left 08/06/2023   Procedure: REVERSE SHOULDER ARTHROPLASTY WITH BICEPS TENODESIS.;  Surgeon: Elner Hahn, MD;  Location: [facility] ORS;  Service: Orthopedics;  Laterality: Left;    Physical Exam   Triage Vital Signs: ED Triage Vitals  Encounter Vitals Group     BP 05/19/24 0623 (!) 150/86     Systolic BP Percentile --      Diastolic BP Percentile --      Pulse Rate 05/19/24 0623 74     Resp 05/19/24 0623 17     Temp 05/19/24 0623 97.6 F (36.4 C)     Temp Source 05/19/24 0623 Oral     SpO2 05/19/24 0623 97 %     Weight 05/19/24 0614 156 lb (70.8 kg)     Height 05/19/24 0614 5\' 6"  (1.676 m)     Head Circumference --      Peak Flow --      Pain Score 05/19/24 0614 10  Pain Loc --      Pain Education --      Exclude from Growth Chart --     Most recent vital signs: Vitals:   05/19/24 0700 05/19/24 1015  BP: (!) 157/75 (!) 144/73  Pulse: 68 91  Resp:  18  Temp:  97.8 F (36.6 C)  SpO2: 96% 95%    General: Awake, no distress.  CV:  Good peripheral perfusion.  Regular rate and rhythm, normal distal pulses Resp:  Normal effort.  Clear to auscultation Abd:  No distention.  Soft with suprapubic and left lower quadrant tenderness, mild guarding. Other:  Moist oral mucosa, no lower extremity edema   ED Results / Procedures / Treatments   Labs (all labs ordered are listed, but only abnormal results are displayed) Labs Reviewed  COMPREHENSIVE METABOLIC PANEL WITH GFR - Abnormal; Notable for the following components:      Result Value   Sodium  133 (*)    Potassium 3.3 (*)    Chloride 95 (*)    Glucose, Bld 111 (*)    Albumin 3.4 (*)    All other components within normal limits  URINALYSIS, ROUTINE W REFLEX MICROSCOPIC - Abnormal; Notable for the following components:   Color, Urine YELLOW (*)    APPearance CLEAR (*)    Specific Gravity, Urine 1.004 (*)    Ketones, ur 20 (*)    Leukocytes,Ua TRACE (*)    Bacteria, UA RARE (*)    All other components within normal limits  CBC WITH DIFFERENTIAL/PLATELET  LIPASE, BLOOD     EKG    RADIOLOGY CT abdomen pelvis interpreted by me, negative for intra-abdominal free air or bowel obstruction.  Radiology report reviewed noting L1 compression fracture   PROCEDURES:  Procedures   MEDICATIONS ORDERED IN ED: Medications  sodium chloride  0.9 % bolus 500 mL (0 mLs Intravenous Stopped 05/19/24 0719)  ondansetron  (ZOFRAN ) injection 4 mg (4 mg Intravenous Given 05/19/24 0644)  acetaminophen  (TYLENOL ) tablet 650 mg (650 mg Oral Given 05/19/24 0650)  morphine (PF) 2 MG/ML injection 2 mg (2 mg Intravenous Given 05/19/24 0653)  ketorolac (TORADOL) 15 MG/ML injection 15 mg (15 mg Intravenous Given 05/19/24 0652)  iohexol (OMNIPAQUE) 300 MG/ML solution 100 mL (100 mLs Intravenous Contrast Given 05/19/24 0759)  pantoprazole (PROTONIX) injection 40 mg (40 mg Intravenous Given 05/19/24 0911)  sodium chloride  0.9 % bolus 500 mL (0 mLs Intravenous Stopped 05/19/24 0935)  oxyCODONE -acetaminophen  (PERCOCET/ROXICET) 5-325 MG per tablet 1 tablet (1 tablet Oral Given 05/19/24 0909)     IMPRESSION / MDM / ASSESSMENT AND PLAN / ED COURSE  I reviewed the triage vital signs and the nursing notes.  DDx: Diverticulitis, cystitis, ureterolithiasis, ovarian mass, constipation, GI perforation/intra-abdominal abscess  Patient's presentation is most consistent with acute presentation with potential threat to life or bodily function.  Patient presents with left lower quadrant abdominal pain, worrisome for  refractory diverticulitis, possibly complicated.  She is nontoxic with normal vital signs.  Received morphine and Zofran  for pain and nausea relief so far.  Awaiting CT abdomen pelvis.   Clinical Course as of 05/19/24 1210  Thu May 19, 2024  0902 CT reveals L1 comp. Fx. No intra-abd findings. Will attempt PO pain control, PO trial. D/w nsgy who rec LSO brace and f/u if pain controlled.  [PS]    Clinical Course User Index [PS] Jacquie Maudlin, MD    ----------------------------------------- 12:10 PM on 05/19/2024 ----------------------------------------- Pain well-controlled, ambulatory.  Not require admission, stable for discharge  FINAL CLINICAL IMPRESSION(S) / ED DIAGNOSES   Final diagnoses:  Closed compression fracture of body of L1 vertebra (HCC)     Rx / DC Orders   ED Discharge Orders          Ordered    oxyCODONE  (ROXICODONE ) 5 MG immediate release tablet  Every 4 hours PRN        05/19/24 1210    ondansetron  (ZOFRAN -ODT) 4 MG disintegrating tablet  Every 8 hours PRN        05/19/24 1210             Note:  This document was prepared using Dragon voice recognition software and may include unintentional dictation errors.   Jacquie Maudlin, MD 05/19/24 [phone]

## 2024-05-19 NOTE — ED Notes (Signed)
 LSO  Brace ordered Per Dr . Vicenta Graft MD.

## 2024-05-19 NOTE — Progress Notes (Signed)
 Orthopedic Tech Progress Note Patient Details:  Samantha Ford [birth year] [id number] Called in order to [facility] for LSO Patient ID: Samantha Ford, female   DOB: Jan 23, 1941, 83 y.o.   MRN: [id number]  Samantha Ford 05/19/2024, 8:53 AM

## 2024-05-19 NOTE — ED Triage Notes (Signed)
 Patient ambulatory to triage with steady gait, without difficulty or distress noted; pt st since Friday having left lower abd pain radiating into back accomp by N/V; st seen at [facility] Friday with negative findings; currently taking antibiotics for diverticulitis

## 2024-05-30 ENCOUNTER — Other Ambulatory Visit: Payer: Self-pay

## 2024-05-30 DIAGNOSIS — S32010A Wedge compression fracture of first lumbar vertebra, initial encounter for closed fracture: Secondary | ICD-10-CM

## 2024-05-31 NOTE — Progress Notes (Unsigned)
 Referring Physician:  Nestor Banter, MD [phone] [address] [facility] [place],  [place] [address]  Primary Physician:  Nestor Banter, MD  History of Present Illness: 06/02/2024 Ms. Samantha Ford has a history of breast CA, GERD, hyperlipidemia, diverticulitis.   Seen in ED on 05/13/24 and again on 05/19/24 for back pain. Found to have L1 compression fracture on 05/19/24 that was new from CT done on 05/13/24. She was placed in LSO brace.  She is here for follow up. She complains of 3 week history of severe constant back pain with no leg pain. She has some radiation of pain into her lower abdomen bilaterally. No known injury. Pain is worse with standing and moving. Pain is better with laying down. She has been wearing LSO and it makes her pain worse.   She is taking prn oxycodone . It is not helping.   No bowel or bladder incontinence.   The symptoms are causing a significant impact on the patient's life.   Review of Systems:  A 10 point review of systems is negative, except for the pertinent positives and negatives detailed in the HPI.  Past Medical History: Past Medical History:  Diagnosis Date   Breast cancer (HCC) 10/22/2013   Mastectomy in 2015   Cancer [facility])    right breast 12/06/2013   GERD (gastroesophageal reflux disease)    Hyperlipidemia    Insomnia    Macular degeneration of right eye    Osteoarthritis    Pneumonia    Pneumothorax     Past Surgical History: Past Surgical History:  Procedure Laterality Date   ANKLE FRACTURE SURGERY Right 09/30/2010   BREAST BIOPSY Left 11/29/2015   neg, ribbon clip   BREAST BIOPSY Left 11/29/2015   neg, coil clip    KNEE ARTHROSCOPY Right 04/04/2008   MASTECTOMY Right 12/29/2013   PARTIAL MASTECTOMY WITH AXILLARY SENTINEL LYMPH NODE BIOPSY Right 12/06/2013   REVERSE SHOULDER ARTHROPLASTY Left 08/06/2023   Procedure: REVERSE SHOULDER ARTHROPLASTY WITH BICEPS TENODESIS.;  Surgeon: Elner Hahn, MD;  Location: [facility] ORS;  Service: Orthopedics;  Laterality: Left;    Allergies: Allergies as of 06/02/2024   (No Known Allergies)    Medications: Outpatient Encounter Medications as of 06/02/2024  Medication Sig   acetaminophen  (TYLENOL ) 500 MG tablet Take 1,000 mg by mouth at bedtime as needed.   acetaminophen  (TYLENOL ) 500 MG tablet Take 2 tablets (1,000 mg total) by mouth every 8 (eight) hours.   cetirizine (ZYRTEC) 10 MG tablet Take 10 mg by mouth daily.   Cholecalciferol (VITAMIN D3) 50 MCG CAPS Take 1 capsule by mouth at bedtime.   diphenhydrAMINE (BENADRYL) 25 MG tablet Take 25 mg by mouth at bedtime.   HYDROcodone -acetaminophen  (NORCO/VICODIN) 5-325 MG tablet Take 1-2 tablets by mouth every 6 (six) hours as needed for moderate pain or severe pain.   ibuprofen (ADVIL) 200 MG tablet Take 400 mg by mouth.   lidocaine  (LIDODERM ) 5 % Place 1 patch onto the skin daily. Remove & Discard patch within 12 hours or as directed by MD   Magnesium 250 MG TABS Take 1 tablet by mouth at bedtime.   Methylcobalamin 500 MCG CHEW Chew 2 capsules by mouth at bedtime.   Multiple Vitamins-Minerals (MULTIVITAMIN ADULTS 50+ PO) Take 1 tablet by mouth at bedtime.   Multiple Vitamins-Minerals (PRESERVISION AREDS 2) CAPS Take 1 capsule by mouth at bedtime.   Omega-3 Fatty Acids (OMEGA-3 FISH OIL) 1200 MG CAPS Take 1 capsule by  mouth at bedtime.   omeprazole (PRILOSEC) 20 MG capsule Take 20 mg by mouth daily.   ondansetron  (ZOFRAN -ODT) 4 MG disintegrating tablet Take 1 tablet (4 mg total) by mouth every 8 (eight) hours as needed for nausea or vomiting.   oxyCODONE  (ROXICODONE ) 5 MG immediate release tablet Take 1 tablet (5 mg total) by mouth every 6 (six) hours as needed for severe pain (pain score 7-10).   pravastatin (PRAVACHOL) 20 MG tablet Take 20 mg by mouth at bedtime.   traZODone (DESYREL) 50 MG tablet Take 50 mg by mouth at bedtime.   [DISCONTINUED] naproxen  (NAPROSYN ) 375 MG tablet Take 1  tablet (375 mg total) by mouth 2 (two) times daily.   No facility-administered encounter medications on file as of 06/02/2024.    Social History: Social History   Tobacco Use   Smoking status: Never   Smokeless tobacco: Never  Vaping Use   Vaping status: Never Used  Substance Use Topics   Alcohol use: Not Currently   Drug use: Never    Family Medical History: Family History  Problem Relation Age of Onset   Breast cancer Neg Hx     Physical Examination: Vitals:   06/02/24 1312  BP: 128/86      Awake, alert, oriented to person, place, and time.  Speech is clear and fluent. Fund of knowledge is appropriate.   Cranial Nerves: Pupils equal round and reactive to light.  Facial tone is symmetric.    Mild tenderness TL junction.   No abnormal lesions on exposed skin.   Strength: Side Biceps Triceps Deltoid Interossei Grip Wrist Ext. Wrist Flex.  R 5 5 5 5 5 5 5   L 5 5 5 5 5 5 5    Side Iliopsoas Quads Hamstring PF DF EHL  R 5 5 5 5 5 5   L 5 5 5 5 5 5    Reflexes are 1+ and symmetric at the biceps, brachioradialis, patella and achilles.   Hoffman's is absent.  Clonus is not present.   Bilateral upper and lower extremity sensation is intact to light touch.     No pain with IR/ER of both hips.   Gait is slow.   Medical Decision Making  Imaging: CT abdomen and pelvis 05/19/24:  MPRESSION: 1. No acute findings in the abdomen or pelvis. Specifically, no findings to explain the patient's history of abdominal pain. 2. Large hiatal hernia. 3. Left colonic diverticulosis without diverticulitis. 4. Inferior endplate compression fracture at L1 is new/progressive since lumbar spine CT of 6 days ago with progressive loss of height anteriorly. There is probably some mild paraspinal edema at this level. 5.  Aortic Atherosclerosis (ICD10-I70.0).     Electronically Signed   By: Donnal Fusi M.D.   On: 05/19/2024 08:21  I have personally reviewed the images and agree with  the above interpretation.  Assessment and Plan: Ms. Doi has L1 compression fracture with no known injury. She continues with severe constant back pain with no leg pain. She has some radiation of pain into her lower abdomen bilaterally.   No new imaging. CT abdomen from ED reviewed as above.   Treatment options discussed with patient and following plan made:   - Recommend TLSO brace. She will go to [facility] today to get one. Wear when up and walking. Do not sleep in brace.  - No bending, twisting, or lifting.  - Stop oxycodone . New prescription for norco 5 (has helped in the past). PMP reviewed and is appropriate.  -  MRI of lumbar spine ordered to further evaluate L1 compression fracture and to see if she is a candidate for kyphoplasty.  - I don't think abdominal pain is lumbar mediated, she should follow up with PCP regarding this.  - Will schedule phone visit to review MRI results once I get them back.   I spent a total of 40 minutes in face-to-face and non-face-to-face activities related to this patient's care today including review of outside records, review of imaging, review of symptoms, physical exam, discussion of differential diagnosis, discussion of treatment options, and documentation.   Thank you for involving me in the care of this patient.   Lucetta Russel PA-C Dept. of Neurosurgery

## 2024-06-02 ENCOUNTER — Other Ambulatory Visit: Payer: Self-pay | Admitting: Family Medicine

## 2024-06-02 ENCOUNTER — Inpatient Hospital Stay
Admission: RE | Admit: 2024-06-02 | Discharge: 2024-06-02 | Disposition: A | Discharge status: Home / Self Care | Payer: Self-pay | Source: Ambulatory Visit | Attending: Orthopedic Surgery | Admitting: Orthopedic Surgery

## 2024-06-02 ENCOUNTER — Ambulatory Visit: Admitting: Orthopedic Surgery

## 2024-06-02 ENCOUNTER — Encounter: Payer: Self-pay | Admitting: Orthopedic Surgery

## 2024-06-02 VITALS — BP 128/86 | Ht 66.0 in | Wt 155.0 lb

## 2024-06-02 DIAGNOSIS — S32010A Wedge compression fracture of first lumbar vertebra, initial encounter for closed fracture: Secondary | ICD-10-CM

## 2024-06-02 DIAGNOSIS — Z049 Encounter for examination and observation for unspecified reason: Secondary | ICD-10-CM

## 2024-06-02 MED ORDER — HYDROCODONE-ACETAMINOPHEN 5-325 MG PO TABS: 1.0000 | ORAL_TABLET | Freq: Four times a day (QID) | ORAL | 0 refills | Status: DC | PRN

## 2024-06-02 NOTE — Patient Instructions (Signed)
 It was so nice to see you today. Thank you so much for coming in.    You have a broken bone at L1 and I think this is causing your back pain.   Go to the [facility] now to get a new brace. Wear when up and walking. Do not wear to sleep. Can loosen or take off if sitting and watching TV.   [facility] [address] [place]  [phone]  No bending, twisting, or lifting.   I don't think the pain in your abdomen is from your back, I would follow up with your PCP about this.   I want to get an MRI of your lower back to look into things further. We will get this approved through your insurance and [facility] will call you to schedule the appointment. Ask about your patient responsibility. You do not need to pay this prior to getting MRI, they can bill you.   [facility] (building with the white pillars) is located off of [address]. The address is [address], [place], [place] [address].    After you have the MRI, it takes 14-21 days for me to get the results back. Once I have them, we will call you to schedule a follow up phone visit with me to review them.   Depending on your MRI, we may be able to continue a kyphoplasty procedure (cement in the bone).   Stop the oxycodone . I sent in hydrocodone  to your pharmacy. Take only as needed, it can make you sleepy and/or constipated.   Please do not hesitate to call if you have any questions or concerns. You can also message me in MyChart.   Lucetta Russel PA-C [phone]     The physicians and staff at [facility] at [facility] are committed to providing excellent care. You may receive a survey asking for feedback about your experience at our office. We value you your feedback and appreciate you taking the time to to fill it out. The [facility] leadership team is also available to discuss your experience in person, feel free to contact us  [phone].

## 2024-11-24 ENCOUNTER — Emergency Department

## 2024-11-24 ENCOUNTER — Observation Stay

## 2024-11-24 ENCOUNTER — Observation Stay: Admission: EM | Admit: 2024-11-24 | Discharge: 2024-11-28 | Disposition: A | Attending: Student | Admitting: Student

## 2024-11-24 ENCOUNTER — Other Ambulatory Visit: Payer: Self-pay

## 2024-11-24 DIAGNOSIS — R52 Pain, unspecified: Secondary | ICD-10-CM | POA: Diagnosis not present

## 2024-11-24 DIAGNOSIS — E876 Hypokalemia: Secondary | ICD-10-CM | POA: Diagnosis not present

## 2024-11-24 DIAGNOSIS — Z515 Encounter for palliative care: Secondary | ICD-10-CM | POA: Diagnosis not present

## 2024-11-24 DIAGNOSIS — Z9011 Acquired absence of right breast and nipple: Secondary | ICD-10-CM | POA: Diagnosis not present

## 2024-11-24 DIAGNOSIS — E785 Hyperlipidemia, unspecified: Secondary | ICD-10-CM | POA: Diagnosis present

## 2024-11-24 DIAGNOSIS — M4856XA Collapsed vertebra, not elsewhere classified, lumbar region, initial encounter for fracture: Secondary | ICD-10-CM

## 2024-11-24 DIAGNOSIS — D72819 Decreased white blood cell count, unspecified: Secondary | ICD-10-CM | POA: Diagnosis not present

## 2024-11-24 DIAGNOSIS — R1032 Left lower quadrant pain: Principal | ICD-10-CM

## 2024-11-24 DIAGNOSIS — M549 Dorsalgia, unspecified: Secondary | ICD-10-CM | POA: Diagnosis present

## 2024-11-24 DIAGNOSIS — M8008XA Age-related osteoporosis with current pathological fracture, vertebra(e), initial encounter for fracture: Secondary | ICD-10-CM | POA: Diagnosis not present

## 2024-11-24 DIAGNOSIS — S22000A Wedge compression fracture of unspecified thoracic vertebra, initial encounter for closed fracture: Secondary | ICD-10-CM | POA: Diagnosis not present

## 2024-11-24 DIAGNOSIS — S32000A Wedge compression fracture of unspecified lumbar vertebra, initial encounter for closed fracture: Secondary | ICD-10-CM | POA: Diagnosis present

## 2024-11-24 DIAGNOSIS — Z853 Personal history of malignant neoplasm of breast: Secondary | ICD-10-CM

## 2024-11-24 DIAGNOSIS — K219 Gastro-esophageal reflux disease without esophagitis: Secondary | ICD-10-CM | POA: Diagnosis not present

## 2024-11-24 DIAGNOSIS — C7952 Secondary malignant neoplasm of bone marrow: Secondary | ICD-10-CM | POA: Diagnosis not present

## 2024-11-24 DIAGNOSIS — M1990 Unspecified osteoarthritis, unspecified site: Secondary | ICD-10-CM | POA: Diagnosis not present

## 2024-11-24 DIAGNOSIS — E871 Hypo-osmolality and hyponatremia: Secondary | ICD-10-CM | POA: Diagnosis not present

## 2024-11-24 DIAGNOSIS — M8440XA Pathological fracture, unspecified site, initial encounter for fracture: Secondary | ICD-10-CM | POA: Diagnosis present

## 2024-11-24 LAB — URINALYSIS, W/ REFLEX TO CULTURE (INFECTION SUSPECTED)
Bacteria, UA: NONE SEEN
Bilirubin Urine: NEGATIVE
Glucose, UA: NEGATIVE mg/dL
Hgb urine dipstick: NEGATIVE
Ketones, ur: 5 mg/dL — AB
Leukocytes,Ua: NEGATIVE
Nitrite: NEGATIVE
Protein, ur: NEGATIVE mg/dL
Specific Gravity, Urine: 1.01 (ref 1.005–1.030)
pH: 6 (ref 5.0–8.0)

## 2024-11-24 LAB — LIPASE, BLOOD: Lipase: 34 U/L (ref 11–51)

## 2024-11-24 LAB — COMPREHENSIVE METABOLIC PANEL WITH GFR
ALT: 9 U/L (ref 0–44)
AST: 31 U/L (ref 15–41)
Albumin: 4 g/dL (ref 3.5–5.0)
Alkaline Phosphatase: 112 U/L (ref 38–126)
Anion gap: 14 (ref 5–15)
BUN: 11 mg/dL (ref 8–23)
CO2: 23 mmol/L (ref 22–32)
Calcium: 9.5 mg/dL (ref 8.9–10.3)
Chloride: 96 mmol/L — ABNORMAL LOW (ref 98–111)
Creatinine, Ser: 0.69 mg/dL (ref 0.44–1.00)
GFR, Estimated: >60 mL/min (ref >60)
Glucose, Bld: 98 mg/dL (ref 70–99)
Potassium: 3.4 mmol/L — ABNORMAL LOW (ref 3.5–5.1)
Sodium: 133 mmol/L — ABNORMAL LOW (ref 135–145)
Total Bilirubin: 0.4 mg/dL (ref 0.0–1.2)
Total Protein: 7.4 g/dL (ref 6.5–8.1)

## 2024-11-24 LAB — LACTIC ACID, PLASMA: Lactic Acid, Venous: 1 mmol/L (ref 0.5–1.9)

## 2024-11-24 LAB — CBC
HCT: 32.8 % — ABNORMAL LOW (ref 36.0–46.0)
Hemoglobin: 10.8 g/dL — ABNORMAL LOW (ref 12.0–15.0)
MCH: 28.6 pg (ref 26.0–34.0)
MCHC: 32.9 g/dL (ref 30.0–36.0)
MCV: 87 fL (ref 80.0–100.0)
Platelets: 195 K/uL (ref 150–400)
RBC: 3.77 MIL/uL — ABNORMAL LOW (ref 3.87–5.11)
RDW: 14.2 % (ref 11.5–15.5)
WBC: 4.6 K/uL (ref 4.0–10.5)
nRBC: 0 % (ref 0.0–0.2)

## 2024-11-24 MED ORDER — ONDANSETRON HCL 4 MG PO TABS: 4.0000 mg | ORAL_TABLET | Freq: Four times a day (QID) | ORAL | Status: DC | PRN

## 2024-11-24 MED ORDER — HYDROCODONE-ACETAMINOPHEN 5-325 MG PO TABS
1.0000 | ORAL_TABLET | ORAL | Status: DC | PRN
  Administered 2024-11-24 – 2024-11-26 (×6): 2 via ORAL
  Administered 2024-11-27 – 2024-11-28 (×3): 1 via ORAL
  Administered 2024-11-28: 2 via ORAL
  Filled 2024-11-24 (×2): qty 2
  Filled 2024-11-24: qty 1
  Filled 2024-11-24 (×5): qty 2
  Filled 2024-11-24: qty 1
  Filled 2024-11-24 (×2): qty 2

## 2024-11-24 MED ORDER — GADOBUTROL 1 MMOL/ML IV SOLN
6.0000 mL | Freq: Once | INTRAVENOUS | Status: AC | PRN
  Administered 2024-11-24: 6 mL via INTRAVENOUS

## 2024-11-24 MED ORDER — HYDROMORPHONE HCL 1 MG/ML IJ SOLN
1.0000 mg | INTRAMUSCULAR | Status: DC | PRN
  Administered 2024-11-24 – 2024-11-27 (×5): 1 mg via INTRAVENOUS
  Filled 2024-11-24 (×6): qty 1

## 2024-11-24 MED ORDER — TRAZODONE HCL 50 MG PO TABS
50.0000 mg | ORAL_TABLET | Freq: Every day | ORAL | Status: DC
  Administered 2024-11-24 – 2024-11-27 (×3): 50 mg via ORAL
  Filled 2024-11-24 (×3): qty 1

## 2024-11-24 MED ORDER — ENOXAPARIN SODIUM 40 MG/0.4ML IJ SOSY
40.0000 mg | PREFILLED_SYRINGE | INTRAMUSCULAR | Status: DC
  Administered 2024-11-24 – 2024-11-27 (×4): 40 mg via SUBCUTANEOUS
  Filled 2024-11-24 (×5): qty 0.4

## 2024-11-24 MED ORDER — LACTATED RINGERS IV BOLUS
1000.0000 mL | Freq: Once | INTRAVENOUS | Status: AC
  Administered 2024-11-24: 1000 mL via INTRAVENOUS

## 2024-11-24 MED ORDER — ACETAMINOPHEN 650 MG RE SUPP: 650.0000 mg | Freq: Four times a day (QID) | RECTAL | Status: DC | PRN

## 2024-11-24 MED ORDER — ONDANSETRON HCL 4 MG/2ML IJ SOLN
4.0000 mg | Freq: Four times a day (QID) | INTRAMUSCULAR | Status: DC | PRN
  Administered 2024-11-25: 4 mg via INTRAVENOUS
  Filled 2024-11-24 (×2): qty 2

## 2024-11-24 MED ORDER — SODIUM CHLORIDE 0.9 % IV SOLN: Freq: Once | INTRAVENOUS | Status: AC

## 2024-11-24 MED ORDER — POLYETHYLENE GLYCOL 3350 17 G PO PACK: 17.0000 g | PACK | Freq: Every day | ORAL | Status: DC | PRN

## 2024-11-24 MED ORDER — ACETAMINOPHEN 325 MG PO TABS
650.0000 mg | ORAL_TABLET | Freq: Four times a day (QID) | ORAL | Status: DC | PRN
  Administered 2024-11-25: 650 mg via ORAL
  Filled 2024-11-24: qty 2

## 2024-11-24 MED ORDER — MORPHINE SULFATE (PF) 2 MG/ML IV SOLN: 2.0000 mg | INTRAVENOUS | Status: DC | PRN

## 2024-11-24 MED ORDER — MORPHINE SULFATE (PF) 2 MG/ML IV SOLN
2.0000 mg | Freq: Once | INTRAVENOUS | Status: AC
  Administered 2024-11-24: 2 mg via INTRAVENOUS
  Filled 2024-11-24: qty 1

## 2024-11-24 MED ORDER — IOHEXOL 300 MG/ML  SOLN
100.0000 mL | Freq: Once | INTRAMUSCULAR | Status: AC | PRN
  Administered 2024-11-24: 100 mL via INTRAVENOUS

## 2024-11-24 MED ORDER — ONDANSETRON HCL 4 MG/2ML IJ SOLN
4.0000 mg | Freq: Once | INTRAMUSCULAR | Status: AC
  Administered 2024-11-24: 4 mg via INTRAVENOUS
  Filled 2024-11-24: qty 2

## 2024-11-24 MED ORDER — IOHEXOL 300 MG/ML  SOLN
75.0000 mL | Freq: Once | INTRAMUSCULAR | Status: AC | PRN
  Administered 2024-11-24: 75 mL via INTRAVENOUS

## 2024-11-24 MED ORDER — POTASSIUM CHLORIDE 10 MEQ/100ML IV SOLN
10.0000 meq | INTRAVENOUS | Status: AC
  Administered 2024-11-24 (×3): 10 meq via INTRAVENOUS
  Filled 2024-11-24 (×3): qty 100

## 2024-11-24 NOTE — H&P (Addendum)
 History and Physical    Samantha Ford FMW:[id number] DOB: [birth year] DOA: 11/24/2024  DOS: the patient was seen and examined on 11/24/2024  PCP: Diedra Lame, MD   Patient coming from: Home  I have personally briefly reviewed patient's old medical records in [facility]  Chief Complaint: Back pain  HPI: Samantha Ford is a pleasant 83 y.o. female with medical history significant for HLD, osteoarthritis, GERD, history breast caner s/p mastectomy in 2015 who came into ED at [facility] for low back pain started Monday 3 days ago.   Patient complains that her pain is in the lower back, sharp, excruciating, 10/10 in intensity, nonradiating, improved with morphine  but did not resolve the pain completely.  She also complained of lower abdominal discomfort.  She denies any history of trauma, fever, chills, nausea, vomiting, diarrhea.  She denies any dysuria, hematuria, hematemesis, melena.  She denies any chest pain, shortness of breath, palpitations.  However, she endorsed that she had a fall a month ago at her son's house from the commode and hurt her head but did not hit her back.  ED Course: Upon arrival to the ED, patient is found to be in severe low back pain, CT of the abdomen was done which showed no intra-abdominal pathology, unfortunately CT scan showed finding concerning for multiple diffuse spinal compression fractures that appear pathological in nature between T7 and L4.  MRI of the thoracolumbar spine was done which showed T5-T12 and L1-L4 compression fractures likely pathological.  Neurosurgical service was consulted.  Patient was started on IV pain medications with morphine , IV Zofran , IV fluid with "Ringer's"  lactate.  Hospitalist service was consulted for evaluation for admission.  Review of Systems:  ROS  All other systems negative except as noted in the HPI.  Past Medical History:  Diagnosis Date   Breast cancer (HCC) 10/22/2013   Mastectomy in 2015   Cancer [facility])    right  breast 12/06/2013   GERD (gastroesophageal reflux disease)    Hyperlipidemia    Insomnia    Macular degeneration of right eye    Osteoarthritis    Pneumonia    Pneumothorax     Past Surgical History:  Procedure Laterality Date   ANKLE FRACTURE SURGERY Right 09/30/2010   BREAST BIOPSY Left 11/29/2015   neg, ribbon clip   BREAST BIOPSY Left 11/29/2015   neg, coil clip    KNEE ARTHROSCOPY Right 04/04/2008   MASTECTOMY Right 12/29/2013   PARTIAL MASTECTOMY WITH AXILLARY SENTINEL LYMPH NODE BIOPSY Right 12/06/2013   REVERSE SHOULDER ARTHROPLASTY Left 08/06/2023   Procedure: REVERSE SHOULDER ARTHROPLASTY WITH BICEPS TENODESIS.;  Surgeon: Edie Norleen PARAS, MD;  Location: [facility] ORS;  Service: Orthopedics;  Laterality: Left;     reports that she has never smoked. She has never used smokeless tobacco. She reports that she does not currently use alcohol. She reports that she does not use drugs.  No Known Allergies  Family History  Problem Relation Age of Onset   Breast cancer Neg Hx     Prior to Admission medications   Medication Sig Start Date End Date Taking? Authorizing Provider  methocarbamol (ROBAXIN) 500 MG tablet Take 500 mg by mouth 2 (two) times daily as needed. 11/01/24  Yes [provider]  omeprazole (PRILOSEC) 40 MG capsule Take 40 mg by mouth daily. 09/29/24 09/29/25 Yes [provider]  tiZANidine (ZANAFLEX) 2 MG tablet Take 2 mg by mouth every 8 (eight) hours as needed. 08/30/24 08/30/25 Yes [provider]  acetaminophen  (TYLENOL )  500 MG tablet Take 1,000 mg by mouth at bedtime as needed.    [provider]  cetirizine (ZYRTEC) 10 MG tablet Take 10 mg by mouth daily.    [provider]  Cholecalciferol (VITAMIN D3) 50 MCG CAPS Take 1 capsule by mouth at bedtime.    [provider]  diphenhydrAMINE (BENADRYL) 25 MG tablet Take 25 mg by mouth at bedtime.    [provider]  HYDROcodone -acetaminophen  (NORCO/VICODIN)  5-325 MG tablet Take 1 tablet by mouth every 6 (six) hours as needed for moderate pain (pain score 4-6). 06/02/24   Hilma Hastings, PA-C  ibandronate (BONIVA) 150 MG tablet Take 150 mg by mouth every 30 (thirty) days.    [provider]  ibuprofen (ADVIL) 200 MG tablet Take 400 mg by mouth.    [provider]  lidocaine  (LIDODERM ) 5 % Place 1 patch onto the skin daily. Remove & Discard patch within 12 hours or as directed by MD 05/13/24   Kommor, Lum, MD  Magnesium 250 MG TABS Take 1 tablet by mouth at bedtime.    [provider]  Methylcobalamin 500 MCG CHEW Chew 2 capsules by mouth at bedtime.    [provider]  Multiple Vitamins-Minerals (MULTIVITAMIN ADULTS 50+ PO) Take 1 tablet by mouth at bedtime.    [provider]  Multiple Vitamins-Minerals (PRESERVISION AREDS 2) CAPS Take 1 capsule by mouth at bedtime.    [provider]  Omega-3 Fatty Acids (OMEGA-3 FISH OIL) 1200 MG CAPS Take 1 capsule by mouth at bedtime.    [provider]  omeprazole (PRILOSEC) 20 MG capsule Take 20 mg by mouth daily.    [provider]  ondansetron  (ZOFRAN -ODT) 4 MG disintegrating tablet Take 1 tablet (4 mg total) by mouth every 8 (eight) hours as needed for nausea or vomiting. 05/19/24   Viviann Pastor, MD  pravastatin (PRAVACHOL) 20 MG tablet Take 20 mg by mouth at bedtime.    [provider]  traMADol (ULTRAM) 50 MG tablet Take 50 mg by mouth 2 (two) times daily as needed for moderate pain (pain score 4-6). 11/01/24   [provider]  traZODone (DESYREL) 50 MG tablet Take 50 mg by mouth at bedtime.    [provider]    Physical Exam: Vitals:   11/24/24 0155 11/24/24 0302 11/24/24 0430 11/24/24 0844  BP: (!) 179/99 (!) 168/84 (!) 159/87   Pulse: 92 84 87   Resp: 17 14 14    Temp:    98.2 F (36.8 C)  TempSrc:    Oral  SpO2: 97% 98% 96%   Weight: 62.7 kg     Height:        Physical Exam    Constitutional: Alert, awake, calm, comfortable HEENT: Neck supple Respiratory: Clear to auscultation B/L, no wheezing, no rales.  Cardiovascular: Regular rate and rhythm, no murmurs / rubs / gallops. No extremity edema. 2+ pedal pulses. No carotid bruits.  Abdomen: Soft, no tenderness, Bowel sounds positive.  Musculoskeletal: no clubbing / cyanosis. Good ROM, no contractures. Normal muscle tone.  Skin: no rashes, lesions, ulcers. Neurologic: CN 2-12 grossly intact. Sensation intact, No focal deficit identified Psychiatric: Alert and oriented x 3. Normal mood.    Labs on Admission: I have personally reviewed following labs and imaging studies  CBC: Recent Labs  Lab 11/24/24 0247  WBC 4.6  HGB 10.8*  HCT 32.8*  MCV 87.0  PLT 195   Basic Metabolic Panel: Recent Labs  Lab 11/24/24 0247  NA 133*  K 3.4*  CL 96*  CO2 23  GLUCOSE 98  BUN 11  CREATININE 0.69  CALCIUM 9.5   GFR: Estimated Creatinine Clearance: 49 mL/min (by C-G formula based on SCr of 0.69 mg/dL). Liver Function Tests: Recent Labs  Lab 11/24/24 0247  AST 31  ALT 9  ALKPHOS 112  BILITOT 0.4  PROT 7.4  ALBUMIN 4.0   Recent Labs  Lab 11/24/24 0247  LIPASE 34   No results for input(s): AMMONIA in the last 168 hours. Coagulation Profile: No results for input(s): INR, PROTIME in the last 168 hours. Cardiac Enzymes: No results for input(s): CKTOTAL, CKMB, CKMBINDEX, TROPONINI, TROPONINIHS in the last 168 hours. BNP (last 3 results) No results for input(s): BNP in the last 8760 hours. HbA1C: No results for input(s): HGBA1C in the last 72 hours. CBG: No results for input(s): GLUCAP in the last 168 hours. Lipid Profile: No results for input(s): CHOL, HDL, LDLCALC, TRIG, CHOLHDL, LDLDIRECT in the last 72 hours. Thyroid Function Tests: No results for input(s): TSH, T4TOTAL, FREET4, T3FREE, THYROIDAB in the last 72 hours. Anemia Panel: No results for  input(s): VITAMINB12, FOLATE, FERRITIN, TIBC, IRON, RETICCTPCT in the last 72 hours. Urine analysis:    Component Value Date/Time   COLORURINE STRAW (A) 11/24/2024 0415   APPEARANCEUR CLEAR (A) 11/24/2024 0415   LABSPEC 1.010 11/24/2024 0415   PHURINE 6.0 11/24/2024 0415   GLUCOSEU NEGATIVE 11/24/2024 0415   HGBUR NEGATIVE 11/24/2024 0415   BILIRUBINUR NEGATIVE 11/24/2024 0415   KETONESUR 5 (A) 11/24/2024 0415   PROTEINUR NEGATIVE 11/24/2024 0415   NITRITE NEGATIVE 11/24/2024 0415   LEUKOCYTESUR NEGATIVE 11/24/2024 0415    Radiological Exams on Admission: I have personally reviewed images CT Chest W Contrast Result Date: 11/24/2024 EXAM: CT CHEST WITH CONTRAST 11/24/2024 07:40:35 AM TECHNIQUE: CT of the chest was performed with the administration of 75 mL of iohexol  (OMNIPAQUE ) 300 MG/ML solution. Multiplanar reformatted images are provided for review. Automated exposure control, iterative reconstruction, and/or weight based adjustment of the mA/kV was utilized to reduce the radiation dose to as low as reasonably achievable. COMPARISON: CT abdomen and pelvis, thoracic and lumbar spine MRI reported separately today. CLINICAL HISTORY: 83 year old female with malignancy, unknown primary, and pathologic spine fractures. FINDINGS: MEDIASTINUM: Calcified coronary artery and aortic atherosclerosis. Heart size within normal limits. No pericardial effusion. The central airways are clear. Central pulmonary arteries appear to be normally enhancing. Moderate sized gastric hiatal hernia. Elevated right hemidiaphragm. LYMPH NODES: No mediastinal, hilar or axillary lymphadenopathy. LUNGS AND PLEURA: Evidence of centrilobular emphysema, mild in the upper lobes. Bilateral lung base mild bronchiectasis, chronic atelectasis and scarring in part related to chronic hiatal hernia and chronic elevation of the right hemidiaphragm. No active lung inflammation. No pleural effusion or pneumothorax. SOFT  TISSUES/BONES: Diffusely abnormal bone mineralization throughout the thoracic spine with numerous thoracic compression fractures, detailed on MRI separately today. There is a mild degree of superimposed thoracic spinal hyperostosis with occasional interbody ankylosis (T3-T4, T9-T10). Heterogeneous bilateral ribs. No acute or pathologic rib fracture is identified. Streak artifact from left shoulder arthroplasty. UPPER ABDOMEN: Visible upper abdominal viscera are stable from CT abdomen and pelvis reported separately today. IMPRESSION: 1. Abnormal thoracic spine as detailed on MRI separately today. Heterogeneous ribs, but no definite acute or pathologic rib fracture identified. 2. No superimposed acute or malignant process identified in the lungs or mediastinum. 3. Lung scarring and atelectasis with a mild degree of underlying Emphysema. Aortic and coronary artery atherosclerosis.  Moderate gastric hiatal hernia and chronic elevated right hemidiaphragm. Electronically signed by: Helayne Hurst MD 11/24/2024 09:09 AM EST RP Workstation: [id number]   MR Lumbar Spine W Wo Contrast Result Date: 11/24/2024 EXAM: MRI LUMBAR SPINE 11/24/2024 07:11:00 AM TECHNIQUE: Multiplanar multisequence MRI of the lumbar spine was performed without and with the administration of intravenous contrast. 6 mL (gadobutrol (GADAVIST) 1 MMOL/ML injection 6 mL GADOBUTROL 1 MMOL/ML IV SOLN). COMPARISON: Thoracic MRI 11/24/2024 reported separately. CT abdomen and pelvis 11/24/2024 reported separately. CLINICAL HISTORY: 83 year old female with new widespread spinal compression fractures and diffuse heterogeneous skeletal mineralization on CT this morning. FINDINGS: Normal lumbar segmentation, concordant with thoracic numbering today. BONES AND ALIGNMENT: Diffusely heterogeneous T1 marrow signal throughout the visible spine. Heterogeneous marrow in the sacrum and pelvis although less affected (series 13 image 39). Lower thoracic spinal compression  fractures redemonstrated. Lumbar spinal compression fractures range from the L1 level (74% loss of vertebral body height) to the L2 level (4% loss of vertebral body height). All levels including the central sacrum demonstrate diffuse increased STIR hyperintensity and heterogeneous hyperenhancement following contrast (series 14 image 6). Sacrum appears grossly intact. Retropulsion of the L1 posterior inferior endplate results in mild spinal stenosis seen on series 12 image 12. No extraosseous extension of tumor or soft tissue is identified in the lumbar spine. SPINAL CORD: Normal conus medullaris at L1. No signal abnormality in the visible lower thoracic spinal cord or conus. No abnormal intradural enhancement. No dural thickening. Normal cauda equina nerve roots. SOFT TISSUES: No paraspinal mass. Stable visible abdominal viscera. DEGENERATIVE: Pathologic compression fracture related mild spinal stenosis at the L1 level. Otherwise ordinary lumbar spine degeneration including disc bulging and endplate spurring. No high grade lumbar spine stenosis. There is moderate lumbar neural foraminal stenosis at the right L1 and L4 nerve levels. IMPRESSION: 1. Diffuse abnormal marrow signal throughout the spine, as seen on Thoracic MRI. Sacrum and pelvis are less affected. Consider metastatic disease, lymphoproliferative disorder, or less likely multiple myeloma (given heterogeneous sclerosis by CT). 2. Lumbar compression fractures with loss of height ranging from 74% (L1 ) to 4% (L2). Mild spinal and moderate right neural foraminal stenosis at L1 due to retropulsion. No extraosseous tumor or soft tissue. 3. Negative conus medullaris and cauda equina. Electronically signed by: Helayne Hurst MD 11/24/2024 07:33 AM EST RP Workstation: [id number]   MR THORACIC SPINE W WO CONTRAST Result Date: 11/24/2024 EXAM: MRI THORACIC SPINE WITH AND WITHOUT INTRAVENOUS CONTRAST 11/24/2024 07:11:00 AM TECHNIQUE: Multiplanar multisequence MRI of  the thoracic spine was performed with and without the administration of intravenous contrast. COMPARISON: CT abdomen and pelvis 11/24/2024. CLINICAL HISTORY: 83 year old female with diffusely abnormal skeletal bone mineralization and diffuse thoracic and lumbar compression fractures, new from May. FINDINGS: Limited sagittal imaging of the cervical spine demonstrates diffuse abnormality T1 hypointense marrow replacement, only partially sparing the anterior C2 vertebra (series 7 image 3). Thoracic segmentation appears to be normal. BONES AND ALIGNMENT: Heterogeneous diffusely abnormal T1 marrow signal throughout the thoracic vertebrae. Diffuse increased STIR signal within the thoracic vertebrae, including the levels T1 through T4 which appear to remain intact. From T5 through T12 there are thoracic compression fractures with loss of height ranging from T5 level (6% loss of vertebral body height) to the T7 level (48% loss of vertebral body height). No significant thoracic vertebral retropulsion. Also on sagittal STIR images there is a discrete round T6 vertebral body lesion located posteriorly on the right with intense increased STIR signal (series  9 image 7) but is relatively hypoenhancing following contrast. Similar signal abnormality in the T6 lamina and spinous process there (series 9 image 9). Smaller T11 round 11 mm right posterior vertebral body lesion is heterogeneously enhancing on series 14 image 7. Similar rounded lower cervical vertebral lesions are partially visible such as at C7 on the left (series 9 image 10). Visible posterior ribs are also diffusely heterogeneous. There is no extraosseous tumor or soft tissue extension identified in the thoracic spine. SPINAL CORD: Normal thoracic spinal cord signal and morphology. No abnormal intradural enhancement. No dural thickening. SOFT TISSUES: Negative visible soft tissues at the thoracic inlet, mediastinum. Stable visible upper abdominal viscera. DEGENERATIVE  CHANGES: Thoracic spinal canal is capacious. Mild for age thoracic spine disc degeneration including a small central disc protrusion at T2-T3 (series 10 image 14). No thoracic spinal stenosis. IMPRESSION: 1. Widespread thoracic pathologic compression fractures: T5 through T12 with loss of height ranging from 6% to 48%. No retropulsion, extraosseous tumor, or other complicating features. 2. Underlying Diffuse abnormal spinal and rib marrow signal compatible with an infiltrative marrow process such as: metastatic disease, lymphoproliferative disorder, or less likely multiple myeloma (given heterogeneous sclerosis by CT). Multiple small, round and discrete STIR hyperintense vertebral lesions are noted, although not hyperenhancing. 3. No thoracic spinal stenosis. Normal thoracic spinal cord. Electronically signed by: Helayne Hurst MD 11/24/2024 07:24 AM EST RP Workstation: [id number]   CT ABDOMEN PELVIS W CONTRAST Result Date: 11/24/2024 EXAM: CT ABDOMEN AND PELVIS WITH CONTRAST 11/24/2024 04:55:30 AM TECHNIQUE: CT of the abdomen and pelvis was performed with the administration of 100 mL of iohexol  (OMNIPAQUE ) 300 MG/ML solution. Multiplanar reformatted images are provided for review. Automated exposure control, iterative reconstruction, and/or weight-based adjustment of the mA/kV was utilized to reduce the radiation dose to as low as reasonably achievable. COMPARISON: CT abdomen and pelvis 05/19/2024. CLINICAL HISTORY: 83 year old female. LLQ abdominal pain. FINDINGS: LOWER CHEST: No acute abnormality. HEART AND MEDIASTINUM: Advanced left coronary artery calcified atherosclerosis or stent on series 2 image 2. Stable heart size, upper limits of normal. No pericardial effusion. PLEURAL SPACES: No pleural effusion. LUNGS: Chronic elevation of the right hemidiaphragm. Chronic bilateral lung base atelectasis. LIVER: The liver is unremarkable. GALLBLADDER AND BILE DUCTS: Gallbladder is unremarkable. No biliary ductal  dilatation. SPLEEN: No acute abnormality. PANCREAS: No acute abnormality. ADRENAL GLANDS: No acute abnormality. KIDNEYS, URETERS AND BLADDER: Renal parenchymal volume appears stable since May. Renal enhancement and contrast excretion appears symmetric and normal. No stones in the kidneys or ureters. No hydronephrosis. No perinephric or periureteral stranding. Urinary bladder is unremarkable. GI AND BOWEL: Moderate chronic gastric hiatal hernia. Intraabdominal stomach and duodenum appear negative. Widespread diverticulosis of the distal descent and sigmoid colon with no active inflammation. Less pronounced large bowel diverticula elsewhere. Fluid in the ascending colon to the hepatic flexure. No dilated or inflamed bowel loops. There is no bowel obstruction. APPENDIX: Normal gas containing appendix on series 2 image 41. PERITONEUM AND RETROPERITONEUM: No ascites. No free air. VASCULATURE: Advanced calcified aortic atherosclerosis. Major arterial structures and portal venous system remain patent. ILIOFEMORAL ARTERIES: Less pronounced iliofemoral calcified atherosclerosis. LYMPH NODES: No lymphadenopathy. REPRODUCTIVE ORGANS: No acute abnormality. BONES AND SOFT TISSUES: Pronounced abnormal bone mineralization since May this year. At that time, L1 compression fracture was acute/subacute. There are now extensive lower thoracic and lumbar spinal compression fractures from the visible T7 through L4 levels, with associated bone mineralization heterogeneity, including heterogeneous sclerosis throughout the visible ribs and pelvis, which seem to  remain intact. Retropulsion of bone at L1 results in mild spinal stenosis (sagittal image 53). No discrete destructive osseous lesion. Marked change in diffuse skeletal bone mineralization since May, and diffuse spinal compression fractures since that time. Constellation highly suspicious for a pathologic marrow infiltrative process. No discrete destructive bone lesion, but  metastatic disease, myelofibrosis, or similar etiology. No paraspinal soft tissue mass. No focal soft tissue abnormality. IMPRESSION: 1. Marked change in the skeletal bone mineralization since May, with diffuse spinal compression fractures since that time. Constellation highly suspicious for a pathologic marrow infiltrative process. Although no discrete destructive bone lesion, consider Metastatic disease, Myelofibrosis, or similar etiology. 2. Mild retropulsion of L1 resulting in mild spinal stenosis. 3. No superimposed acute or metastatic soft tissue abnormality identified in the abdomen or pelvis. Chronic aortic atherosclerosis, coronary artery atherosclerosis , moderate gastric hiatal hernia. Electronically signed by: Helayne Hurst MD 11/24/2024 05:20 AM EST RP Workstation: [id number]    EKG: My personal interpretation of EKG shows: Sinus rhythm at 90 bpm    Assessment/Plan Principal Problem:   Intractable pain Active Problems:   History of breast cancer   HLD (hyperlipidemia)   Lumbar compression fracture [facility])   Thoracic compression fracture [facility])   Pathological fracture    Assessment and Plan: 83 year old female who had a history of breast cancer s/p mastectomy in 2015, HLD, osteoarthritis, GERD who came into ED for lower abdominal and back pain.  1.  Thoracolumbar pathological compression fracture - She will be placed in observation - Given the history of breast cancer, this could be related to that. - There is no bowel bladder involvement, no signs of cauda equina - Neurosurgical service was contacted, waiting for consultation. - She will be given pain medication with Dilaudid. - Supportive measure including brace - There is no hypercalcemia  2.  Lower abdominal discomfort - UA is negative for UTI - Abdominal imaging did not show any acute finding - Symptomatic management  3.  HLD/GERD - Resume home medications  4.  Hypokalemia - Replace potassium and will check  potassium level     DVT prophylaxis: Lovenox Code Status: Full Code Family Communication: Son was at bedside Disposition Plan: Home versus rehab Consults called: Neurosurgery Admission status: Observation, Med-Surg   Nena Rebel, MD [facility] 11/24/2024, 9:17 AM

## 2024-11-24 NOTE — Consult Note (Signed)
 Consulting Department:  Emergency Dept  Primary Physician:  Diedra Lame, MD  Chief Complaint: Multiple thoracolumbar compression fractures  History of Present Illness: 11/24/2024 Samantha Ford is a 83 y.o. female who presents with the chief complaint of back pain of approximately 2 to 3 days with radiation into her left inguinal region.  She has a history of an L1 compression fracture which was treated with a TLSO.  She did get improvement with her conservative management at that time.  She presents today with worsening back pain of approximately 2 to 3 days with severe radiation into her left flank/inguinal region.  She is not having any numbness or tingling going down into her legs.  No new bowel or bladder issues.  She does have a history of breast cancer but no radiation or chemo was needed after her resection.  The symptoms are causing a significant impact on the patient's life.   Review of Systems:  A 10 point review of systems is negative, except for the pertinent positives and negatives detailed in the HPI.  Past Medical History: Past Medical History:  Diagnosis Date   Breast cancer (HCC) 10/22/2013   Mastectomy in 2015   Cancer [facility])    right breast 12/06/2013   GERD (gastroesophageal reflux disease)    Hyperlipidemia    Insomnia    Macular degeneration of right eye    Osteoarthritis    Pneumonia    Pneumothorax     Past Surgical History: Past Surgical History:  Procedure Laterality Date   ANKLE FRACTURE SURGERY Right 09/30/2010   BREAST BIOPSY Left 11/29/2015   neg, ribbon clip   BREAST BIOPSY Left 11/29/2015   neg, coil clip    KNEE ARTHROSCOPY Right 04/04/2008   MASTECTOMY Right 12/29/2013   PARTIAL MASTECTOMY WITH AXILLARY SENTINEL LYMPH NODE BIOPSY Right 12/06/2013   REVERSE SHOULDER ARTHROPLASTY Left 08/06/2023   Procedure: REVERSE SHOULDER ARTHROPLASTY WITH BICEPS TENODESIS.;  Surgeon: Edie Norleen PARAS, MD;  Location: [facility] ORS;  Service: Orthopedics;   Laterality: Left;    Allergies: Allergies as of 11/24/2024   (No Known Allergies)    Medications:  Current Facility-Administered Medications:    acetaminophen  (TYLENOL ) tablet 650 mg, 650 mg, Oral, Q6H PRN **OR** acetaminophen  (TYLENOL ) suppository 650 mg, 650 mg, Rectal, Q6H PRN, Cleatus Delayne GAILS, MD   enoxaparin (LOVENOX) injection 40 mg, 40 mg, Subcutaneous, Q24H, Paudel, Keshab, MD   HYDROcodone -acetaminophen  (NORCO/VICODIN) 5-325 MG per tablet 1-2 tablet, 1-2 tablet, Oral, Q4H PRN, Cleatus Delayne GAILS, MD, 2 tablet at 11/24/24 0843   HYDROmorphone (DILAUDID) injection 1 mg, 1 mg, Intravenous, Q4H PRN, Paudel, Keshab, MD   ondansetron  (ZOFRAN ) tablet 4 mg, 4 mg, Oral, Q6H PRN **OR** ondansetron  (ZOFRAN ) injection 4 mg, 4 mg, Intravenous, Q6H PRN, Cleatus Delayne GAILS, MD   polyethylene glycol (MIRALAX / GLYCOLAX) packet 17 g, 17 g, Oral, Daily PRN, Paudel, Keshab, MD  Current Outpatient Medications:    methocarbamol (ROBAXIN) 500 MG tablet, Take 500 mg by mouth 2 (two) times daily as needed., Disp: , Rfl:    omeprazole (PRILOSEC) 40 MG capsule, Take 40 mg by mouth daily., Disp: , Rfl:    tiZANidine (ZANAFLEX) 2 MG tablet, Take 2 mg by mouth every 8 (eight) hours as needed., Disp: , Rfl:    acetaminophen  (TYLENOL ) 500 MG tablet, Take 1,000 mg by mouth at bedtime as needed., Disp: , Rfl:    cetirizine (ZYRTEC) 10 MG tablet, Take 10 mg by mouth daily., Disp: , Rfl:    Cholecalciferol (VITAMIN  D3) 50 MCG CAPS, Take 1 capsule by mouth at bedtime., Disp: , Rfl:    diphenhydrAMINE (BENADRYL) 25 MG tablet, Take 25 mg by mouth at bedtime., Disp: , Rfl:    HYDROcodone -acetaminophen  (NORCO/VICODIN) 5-325 MG tablet, Take 1 tablet by mouth every 6 (six) hours as needed for moderate pain (pain score 4-6)., Disp: 20 tablet, Rfl: 0   ibandronate (BONIVA) 150 MG tablet, Take 150 mg by mouth every 30 (thirty) days., Disp: , Rfl:    ibuprofen (ADVIL) 200 MG tablet, Take 400 mg by mouth., Disp: , Rfl:     lidocaine  (LIDODERM ) 5 %, Place 1 patch onto the skin daily. Remove & Discard patch within 12 hours or as directed by MD, Disp: 30 patch, Rfl: 0   Magnesium 250 MG TABS, Take 1 tablet by mouth at bedtime., Disp: , Rfl:    Methylcobalamin 500 MCG CHEW, Chew 2 capsules by mouth at bedtime., Disp: , Rfl:    Multiple Vitamins-Minerals (MULTIVITAMIN ADULTS 50+ PO), Take 1 tablet by mouth at bedtime., Disp: , Rfl:    Multiple Vitamins-Minerals (PRESERVISION AREDS 2) CAPS, Take 1 capsule by mouth at bedtime., Disp: , Rfl:    Omega-3 Fatty Acids (OMEGA-3 FISH OIL) 1200 MG CAPS, Take 1 capsule by mouth at bedtime., Disp: , Rfl:    omeprazole (PRILOSEC) 20 MG capsule, Take 20 mg by mouth daily., Disp: , Rfl:    ondansetron  (ZOFRAN -ODT) 4 MG disintegrating tablet, Take 1 tablet (4 mg total) by mouth every 8 (eight) hours as needed for nausea or vomiting., Disp: 20 tablet, Rfl: 0   pravastatin (PRAVACHOL) 20 MG tablet, Take 20 mg by mouth at bedtime., Disp: , Rfl:    traMADol (ULTRAM) 50 MG tablet, Take 50 mg by mouth 2 (two) times daily as needed for moderate pain (pain score 4-6)., Disp: , Rfl:    traZODone (DESYREL) 50 MG tablet, Take 50 mg by mouth at bedtime., Disp: , Rfl:    Social History: Social History   Tobacco Use   Smoking status: Never   Smokeless tobacco: Never  Vaping Use   Vaping status: Never Used  Substance Use Topics   Alcohol use: Not Currently   Drug use: Never    Family Medical History: Family History  Problem Relation Age of Onset   Breast cancer Neg Hx     Physical Examination: Vitals:   11/24/24 0430 11/24/24 0844  BP: (!) 159/87   Pulse: 87   Resp: 14   Temp:  98.2 F (36.8 C)  SpO2: 96%      General: Patient is well developed, well nourished, calm, collected, and in no apparent distress.  NEUROLOGICAL:  General: In no acute distress.   Awake, alert, oriented to person, place, and time.  Pupils equal round and reactive to light.  Facial tone is symmetric.   Tongue protrusion is midline.  There is no pronator drift.  Strength:  Side Iliopsoas Quads Hamstring PF DF EHL  R 5 5 5 5 5 5   L 5 5 5 5 5 5     Bilateral upper and lower extremity sensation is intact to light touch.  1+ reflexes in the bilateral lower extremities  Imaging: MR Lumbar Spine W Wo Contrast Result Date: 11/24/2024 EXAM: MRI LUMBAR SPINE 11/24/2024 07:11:00 AM TECHNIQUE: Multiplanar multisequence MRI of the lumbar spine was performed without and with the administration of intravenous contrast. 6 mL (gadobutrol (GADAVIST) 1 MMOL/ML injection 6 mL GADOBUTROL 1 MMOL/ML IV SOLN). COMPARISON: Thoracic MRI 11/24/2024 reported  separately. CT abdomen and pelvis 11/24/2024 reported separately. CLINICAL HISTORY: 83 year old female with new widespread spinal compression fractures and diffuse heterogeneous skeletal mineralization on CT this morning. FINDINGS: Normal lumbar segmentation, concordant with thoracic numbering today. BONES AND ALIGNMENT: Diffusely heterogeneous T1 marrow signal throughout the visible spine. Heterogeneous marrow in the sacrum and pelvis although less affected (series 13 image 39). Lower thoracic spinal compression fractures redemonstrated. Lumbar spinal compression fractures range from the L1 level (74% loss of vertebral body height) to the L2 level (4% loss of vertebral body height). All levels including the central sacrum demonstrate diffuse increased STIR hyperintensity and heterogeneous hyperenhancement following contrast (series 14 image 6). Sacrum appears grossly intact. Retropulsion of the L1 posterior inferior endplate results in mild spinal stenosis seen on series 12 image 12. No extraosseous extension of tumor or soft tissue is identified in the lumbar spine. SPINAL CORD: Normal conus medullaris at L1. No signal abnormality in the visible lower thoracic spinal cord or conus. No abnormal intradural enhancement. No dural thickening. Normal cauda equina nerve roots.  SOFT TISSUES: No paraspinal mass. Stable visible abdominal viscera. DEGENERATIVE: Pathologic compression fracture related mild spinal stenosis at the L1 level. Otherwise ordinary lumbar spine degeneration including disc bulging and endplate spurring. No high grade lumbar spine stenosis. There is moderate lumbar neural foraminal stenosis at the right L1 and L4 nerve levels. IMPRESSION: 1. Diffuse abnormal marrow signal throughout the spine, as seen on Thoracic MRI. Sacrum and pelvis are less affected. Consider metastatic disease, lymphoproliferative disorder, or less likely multiple myeloma (given heterogeneous sclerosis by CT). 2. Lumbar compression fractures with loss of height ranging from 74% (L1 ) to 4% (L2). Mild spinal and moderate right neural foraminal stenosis at L1 due to retropulsion. No extraosseous tumor or soft tissue. 3. Negative conus medullaris and cauda equina. Electronically signed by: Helayne Hurst MD 11/24/2024 07:33 AM EST RP Workstation: [id number]   MR THORACIC SPINE W WO CONTRAST Result Date: 11/24/2024 EXAM: MRI THORACIC SPINE WITH AND WITHOUT INTRAVENOUS CONTRAST 11/24/2024 07:11:00 AM TECHNIQUE: Multiplanar multisequence MRI of the thoracic spine was performed with and without the administration of intravenous contrast. COMPARISON: CT abdomen and pelvis 11/24/2024. CLINICAL HISTORY: 83 year old female with diffusely abnormal skeletal bone mineralization and diffuse thoracic and lumbar compression fractures, new from May. FINDINGS: Limited sagittal imaging of the cervical spine demonstrates diffuse abnormality T1 hypointense marrow replacement, only partially sparing the anterior C2 vertebra (series 7 image 3). Thoracic segmentation appears to be normal. BONES AND ALIGNMENT: Heterogeneous diffusely abnormal T1 marrow signal throughout the thoracic vertebrae. Diffuse increased STIR signal within the thoracic vertebrae, including the levels T1 through T4 which appear to remain intact. From  T5 through T12 there are thoracic compression fractures with loss of height ranging from T5 level (6% loss of vertebral body height) to the T7 level (48% loss of vertebral body height). No significant thoracic vertebral retropulsion. Also on sagittal STIR images there is a discrete round T6 vertebral body lesion located posteriorly on the right with intense increased STIR signal (series 9 image 7) but is relatively hypoenhancing following contrast. Similar signal abnormality in the T6 lamina and spinous process there (series 9 image 9). Smaller T11 round 11 mm right posterior vertebral body lesion is heterogeneously enhancing on series 14 image 7. Similar rounded lower cervical vertebral lesions are partially visible such as at C7 on the left (series 9 image 10). Visible posterior ribs are also diffusely heterogeneous. There is no extraosseous tumor or soft tissue  extension identified in the thoracic spine. SPINAL CORD: Normal thoracic spinal cord signal and morphology. No abnormal intradural enhancement. No dural thickening. SOFT TISSUES: Negative visible soft tissues at the thoracic inlet, mediastinum. Stable visible upper abdominal viscera. DEGENERATIVE CHANGES: Thoracic spinal canal is capacious. Mild for age thoracic spine disc degeneration including a small central disc protrusion at T2-T3 (series 10 image 14). No thoracic spinal stenosis. IMPRESSION: 1. Widespread thoracic pathologic compression fractures: T5 through T12 with loss of height ranging from 6% to 48%. No retropulsion, extraosseous tumor, or other complicating features. 2. Underlying Diffuse abnormal spinal and rib marrow signal compatible with an infiltrative marrow process such as: metastatic disease, lymphoproliferative disorder, or less likely multiple myeloma (given heterogeneous sclerosis by CT). Multiple small, round and discrete STIR hyperintense vertebral lesions are noted, although not hyperenhancing. 3. No thoracic spinal stenosis.  Normal thoracic spinal cord. Electronically signed by: Helayne Hurst MD 11/24/2024 07:24 AM EST RP Workstation: [id number]   CT ABDOMEN PELVIS W CONTRAST Result Date: 11/24/2024 EXAM: CT ABDOMEN AND PELVIS WITH CONTRAST 11/24/2024 04:55:30 AM TECHNIQUE: CT of the abdomen and pelvis was performed with the administration of 100 mL of iohexol  (OMNIPAQUE ) 300 MG/ML solution. Multiplanar reformatted images are provided for review. Automated exposure control, iterative reconstruction, and/or weight-based adjustment of the mA/kV was utilized to reduce the radiation dose to as low as reasonably achievable. COMPARISON: CT abdomen and pelvis 05/19/2024. CLINICAL HISTORY: 83 year old female. LLQ abdominal pain. FINDINGS: LOWER CHEST: No acute abnormality. HEART AND MEDIASTINUM: Advanced left coronary artery calcified atherosclerosis or stent on series 2 image 2. Stable heart size, upper limits of normal. No pericardial effusion. PLEURAL SPACES: No pleural effusion. LUNGS: Chronic elevation of the right hemidiaphragm. Chronic bilateral lung base atelectasis. LIVER: The liver is unremarkable. GALLBLADDER AND BILE DUCTS: Gallbladder is unremarkable. No biliary ductal dilatation. SPLEEN: No acute abnormality. PANCREAS: No acute abnormality. ADRENAL GLANDS: No acute abnormality. KIDNEYS, URETERS AND BLADDER: Renal parenchymal volume appears stable since May. Renal enhancement and contrast excretion appears symmetric and normal. No stones in the kidneys or ureters. No hydronephrosis. No perinephric or periureteral stranding. Urinary bladder is unremarkable. GI AND BOWEL: Moderate chronic gastric hiatal hernia. Intraabdominal stomach and duodenum appear negative. Widespread diverticulosis of the distal descent and sigmoid colon with no active inflammation. Less pronounced large bowel diverticula elsewhere. Fluid in the ascending colon to the hepatic flexure. No dilated or inflamed bowel loops. There is no bowel obstruction.  APPENDIX: Normal gas containing appendix on series 2 image 41. PERITONEUM AND RETROPERITONEUM: No ascites. No free air. VASCULATURE: Advanced calcified aortic atherosclerosis. Major arterial structures and portal venous system remain patent. ILIOFEMORAL ARTERIES: Less pronounced iliofemoral calcified atherosclerosis. LYMPH NODES: No lymphadenopathy. REPRODUCTIVE ORGANS: No acute abnormality. BONES AND SOFT TISSUES: Pronounced abnormal bone mineralization since May this year. At that time, L1 compression fracture was acute/subacute. There are now extensive lower thoracic and lumbar spinal compression fractures from the visible T7 through L4 levels, with associated bone mineralization heterogeneity, including heterogeneous sclerosis throughout the visible ribs and pelvis, which seem to remain intact. Retropulsion of bone at L1 results in mild spinal stenosis (sagittal image 53). No discrete destructive osseous lesion. Marked change in diffuse skeletal bone mineralization since May, and diffuse spinal compression fractures since that time. Constellation highly suspicious for a pathologic marrow infiltrative process. No discrete destructive bone lesion, but metastatic disease, myelofibrosis, or similar etiology. No paraspinal soft tissue mass. No focal soft tissue abnormality. IMPRESSION: 1. Marked change in the skeletal bone mineralization  since May, with diffuse spinal compression fractures since that time. Constellation highly suspicious for a pathologic marrow infiltrative process. Although no discrete destructive bone lesion, consider Metastatic disease, Myelofibrosis, or similar etiology. 2. Mild retropulsion of L1 resulting in mild spinal stenosis. 3. No superimposed acute or metastatic soft tissue abnormality identified in the abdomen or pelvis. Chronic aortic atherosclerosis, coronary artery atherosclerosis , moderate gastric hiatal hernia. Electronically signed by: Helayne Hurst MD 11/24/2024 05:20 AM EST RP  Workstation: [id number]     I have personally reviewed the images and agree with the above interpretation.  Labs:    Latest Ref Rng & Units 11/24/2024    2:47 AM 05/19/2024    6:43 AM 07/30/2023    1:38 PM  CBC  WBC 4.0 - 10.5 K/uL 4.6  4.6  4.7   Hemoglobin 12.0 - 15.0 g/dL 89.1  87.4  87.3   Hematocrit 36.0 - 46.0 % 32.8  38.8  39.0   Platelets 150 - 400 K/uL 195  213  221       Latest Ref Rng & Units 11/24/2024    2:47 AM 05/19/2024    6:43 AM 07/30/2023    1:38 PM  BMP  Glucose 70 - 99 mg/dL 98  888  886   BUN 8 - 23 mg/dL 11  8  14    Creatinine 0.44 - 1.00 mg/dL 9.30  9.22  9.13   Sodium 135 - 145 mmol/L 133  133  137   Potassium 3.5 - 5.1 mmol/L 3.4  3.3  4.0   Chloride 98 - 111 mmol/L 96  95  100   CO2 22 - 32 mmol/L 23  29  25    Calcium 8.9 - 10.3 mg/dL 9.5  9.3  9.0         Assessment and Plan: Samantha Ford is a pleasant 83 y.o. female with history of an L1 compression fracture, diagnosed with osteoporosis and actively on medication and management.  Was previously treated with a TLSO brace and had improvement in her pain.  Unfortunately over the past few days has had a severe exacerbation of her back pain.  This is now radiating into her left lower flank/inguinal region.  She is not having any numbness weakness or tingling in her lower extremities.  No new bowel or bladder issues.  She states that the pain is quite severe and caused her to come into the emergency department for evaluation.  She has not tried any medical management.  On physical exam she has good strength in her lower extremities without any evidence of new deficit.  No new sensory changes.  Her imaging shows widespread multiple compression fractures since she was last seen.  There is significant number of lesions from T5 all the way down to L1, potentially L4 and L5 as well.  There also appear to be some rib lesions.  In regards to the multiple compression fractures this could be osteoporotic, however would  include in the differential diagnosis a more systemic cause such as hematogenous disease.  We will defer the rest of the workup to our medical colleagues.  From a spinal standpoint she can utilize her brace while up and out of bed or for comfort.  In regards to the left lower extremity radiating pain this is likely a T12 radiculitis from the compression fracture worsening at T12 and L1.  If not contraindicated could consider neuropathic pain medications such as gabapentin or Lyrica depending on her medical comorbidities.  Could also potentially  consider steroid burst however I am unsure whether or not this would be contraindicated if a bone marrow biopsy or blood smear is necessitated.  We remain available for any further questions.  These recommendations were communicated back to our medical colleagues.  Penne MICAEL Sharps, MD/MSCR Dept. of Neurosurgery

## 2024-11-24 NOTE — ED Provider Notes (Addendum)
 [facility] Provider Note    Event Date/Time   First MD Initiated Contact with Patient 11/24/24 0153     (approximate)   History   Back Pain   HPI  Samantha Ford is a 83 y.o. female past medical history significant for hyperlipidemia, osteoarthritis, presents to the emergency department with lower abdominal pain.  Patient states that on Sunday started having some mild lower abdominal pain that has been significantly worsened over the past 2 days.  Complaining of severe pain to her left lower abdomen.  Associated with nausea and multiple episodes of vomiting.  Cannot keep any food down over the past day.  Decreased appetite.  Denies any fever or chills.  Does endorse a history of diverticulitis.  Denies any blood in her stool.  Denies dysuria, urinary urgency or frequency.  No falls or trauma.  Denies prior abdominal surgery.     Physical Exam   Triage Vital Signs: ED Triage Vitals  Encounter Vitals Group     BP --      Girls Systolic BP Percentile --      Girls Diastolic BP Percentile --      Boys Systolic BP Percentile --      Boys Diastolic BP Percentile --      Pulse --      Resp --      Temp 11/24/24 0154 (!) 97.4 F (36.3 C)     Temp src --      SpO2 --      Weight 11/24/24 0155 138 lb 3.2 oz (62.7 kg)     Height 11/24/24 0154 5' 6 (1.676 m)     Head Circumference --      Peak Flow --      Pain Score 11/24/24 0154 10     Pain Loc --      Pain Education --      Exclude from Growth Chart --     Most recent vital signs: Vitals:   11/24/24 0302 11/24/24 0430  BP: (!) 168/84 (!) 159/87  Pulse: 84 87  Resp: 14 14  Temp:    SpO2: 98% 96%    Physical Exam Constitutional:      Appearance: She is well-developed.  HENT:     Head: Atraumatic.  Eyes:     Conjunctiva/sclera: Conjunctivae normal.  Cardiovascular:     Rate and Rhythm: Regular rhythm.  Pulmonary:     Effort: No respiratory distress.  Abdominal:     General: There is  no distension.     Tenderness: There is abdominal tenderness (Left lower quadrant abdominal tenderness to palpation). There is no right CVA tenderness or left CVA tenderness.  Musculoskeletal:        General: Normal range of motion.     Cervical back: Normal range of motion.     Right lower leg: No edema.     Left lower leg: No edema.  Skin:    General: Skin is warm.     Capillary Refill: Capillary refill takes less than 2 seconds.  Neurological:     Mental Status: She is alert. Mental status is at baseline.  Psychiatric:        Mood and Affect: Mood normal.     IMPRESSION / MDM / ASSESSMENT AND PLAN / ED COURSE  I reviewed the triage vital signs and the nursing notes.  Differential diagnosis including diverticulitis, perforation, abscess, small bowel obstruction, gastritis/PUD, symptomatic cholelithiasis, acute cholecystitis, urinary tract infection, kidney stone,  AAA  Given IV fluids, IV antiemetics and IV morphine .  Made NPO.  Plan for CT scan abdomen and pelvis with contrast and lab work.  EKG  I, Clotilda Punter, the attending physician, personally viewed and interpreted this ECG.  EKG showed normal sinus rhythm.  Underlying right bundle branch block.  Has a history of incomplete bundle branch block.  QTc today 476.  QRS 132.  Nonspecific ST changes No tachycardic or bradycardic dysrhythmias while on cardiac telemetry.  RADIOLOGY I independently reviewed imaging, my interpretation of imaging: CT scan abdomen and pelvis with contrast -signs of diverticulosis but no obvious signs of diverticulitis and no obvious abscess or small bowel obstruction.  CT scan read as multiple diffuse compression fractures to the lumbar and thoracic spine concerning for malignancy.   LABS (all labs ordered are listed, but only abnormal results are displayed) Labs interpreted as -    Labs Reviewed  CBC - Abnormal; Notable for the following components:      Result Value   RBC 3.77 (*)     Hemoglobin 10.8 (*)    HCT 32.8 (*)    All other components within normal limits  COMPREHENSIVE METABOLIC PANEL WITH GFR - Abnormal; Notable for the following components:   Sodium 133 (*)    Potassium 3.4 (*)    Chloride 96 (*)    All other components within normal limits  URINALYSIS, W/ REFLEX TO CULTURE (INFECTION SUSPECTED) - Abnormal; Notable for the following components:   Color, Urine STRAW (*)    APPearance CLEAR (*)    Ketones, ur 5 (*)    All other components within normal limits  LACTIC ACID, PLASMA  LIPASE, BLOOD     MDM  Patient presents to the emergency department with acute on chronic back pain.  No falls or trauma.  Also with left lower abdominal pain.  Lab work overall reassuring with no significant leukocytosis.  Mild anemia with a hemoglobin of 10.8.  Creatinine appears to be at baseline with no significant electrolyte abnormalities, mild hyponatremia and mild hypokalemia at 3.4.  Normal lactic acid.  Urine with no signs of a urinary tract infection.  CT scan with no signs of acute intra-abdominal pathology, no signs of acute diverticulitis, abscess or small bowel obstruction.  Unfortunately CT scan with findings concerning for multiple diffuse spinal compression fractures that appear pathologic, compression fractures visible T7-L4.  Requiring multiple doses of IV pain medication.  Discussed the findings with the patient.  Consulted hospitalist for admission for multiple compression spine fractures and concern for pathologic fractures.  No symptoms of cauda equina or epidural compression syndrome do not feel that she needs an emergent MRI.     PROCEDURES:  Critical Care performed: No  Procedures  Patient's presentation is most consistent with acute presentation with potential threat to life or bodily function.   MEDICATIONS ORDERED IN ED: Medications  lactated ringers  bolus 1,000 mL (0 mLs Intravenous Stopped 11/24/24 0353)  ondansetron  (ZOFRAN ) injection 4  mg (4 mg Intravenous Given 11/24/24 0302)  morphine  (PF) 2 MG/ML injection 2 mg (2 mg Intravenous Given 11/24/24 0302)  morphine  (PF) 2 MG/ML injection 2 mg (2 mg Intravenous Given 11/24/24 0355)  iohexol  (OMNIPAQUE ) 300 MG/ML solution 100 mL (100 mLs Intravenous Contrast Given 11/24/24 0448)    FINAL CLINICAL IMPRESSION(S) / ED DIAGNOSES   Final diagnoses:  LLQ abdominal pain  Compression fracture of body of thoracic vertebra (HCC)  Compression fracture of lumbar spine, non-traumatic, initial encounter (HCC)  Rx / DC Orders   ED Discharge Orders     None        Note:  This document was prepared using Dragon voice recognition software and may include unintentional dictation errors.   Suzanne Kirsch, MD 11/24/24 9465    Suzanne Kirsch, MD 11/24/24 9464    Suzanne Kirsch, MD 11/24/24 [phone]

## 2024-11-24 NOTE — ED Notes (Signed)
 NURSE BRANDON RN INFORMED OF BED ASSIGNED

## 2024-11-24 NOTE — ED Notes (Signed)
 Called [facility] for TLSO brace/ Per Ortho tech; Since pt is being admitted call Orth[facility] in-house tech during morning shift to receive brace

## 2024-11-24 NOTE — ED Notes (Signed)
 Pt to MRI

## 2024-11-24 NOTE — ED Triage Notes (Signed)
 Pt reports chronic lower back pain, reports over the past 2 days it has been worse. Pt denies recent fall or injury. Pt states the pain is making her nauseous.

## 2024-11-24 NOTE — Plan of Care (Signed)

## 2024-11-25 DIAGNOSIS — Z515 Encounter for palliative care: Secondary | ICD-10-CM | POA: Diagnosis not present

## 2024-11-25 DIAGNOSIS — R52 Pain, unspecified: Secondary | ICD-10-CM | POA: Diagnosis not present

## 2024-11-25 LAB — COMPREHENSIVE METABOLIC PANEL WITH GFR
ALT: 8 U/L (ref 0–44)
AST: 28 U/L (ref 15–41)
Albumin: 3.9 g/dL (ref 3.5–5.0)
Alkaline Phosphatase: 108 U/L (ref 38–126)
Anion gap: 12 (ref 5–15)
BUN: 13 mg/dL (ref 8–23)
CO2: 28 mmol/L (ref 22–32)
Calcium: 9.3 mg/dL (ref 8.9–10.3)
Chloride: 97 mmol/L — ABNORMAL LOW (ref 98–111)
Creatinine, Ser: 0.71 mg/dL (ref 0.44–1.00)
GFR, Estimated: >60 mL/min (ref >60)
Glucose, Bld: 120 mg/dL — ABNORMAL HIGH (ref 70–99)
Potassium: 4.4 mmol/L (ref 3.5–5.1)
Sodium: 136 mmol/L (ref 135–145)
Total Bilirubin: 0.3 mg/dL (ref 0.0–1.2)
Total Protein: 7.1 g/dL (ref 6.5–8.1)

## 2024-11-25 LAB — CBC
HCT: 31.4 % — ABNORMAL LOW (ref 36.0–46.0)
Hemoglobin: 10.1 g/dL — ABNORMAL LOW (ref 12.0–15.0)
MCH: 28.5 pg (ref 26.0–34.0)
MCHC: 32.2 g/dL (ref 30.0–36.0)
MCV: 88.5 fL (ref 80.0–100.0)
Platelets: 220 K/uL (ref 150–400)
RBC: 3.55 MIL/uL — ABNORMAL LOW (ref 3.87–5.11)
RDW: 14.3 % (ref 11.5–15.5)
WBC: 4.5 K/uL (ref 4.0–10.5)
nRBC: 0.9 % — ABNORMAL HIGH (ref 0.0–0.2)

## 2024-11-25 LAB — PROTIME-INR
INR: 1 (ref 0.8–1.2)
Prothrombin Time: 14 s (ref 11.4–15.2)

## 2024-11-25 MED ORDER — PANTOPRAZOLE SODIUM 40 MG PO TBEC
40.0000 mg | DELAYED_RELEASE_TABLET | Freq: Every day | ORAL | Status: DC
  Administered 2024-11-25 – 2024-11-28 (×4): 40 mg via ORAL
  Filled 2024-11-25 (×4): qty 1

## 2024-11-25 MED ORDER — SENNA 8.6 MG PO TABS
1.0000 | ORAL_TABLET | Freq: Every day | ORAL | Status: DC
  Administered 2024-11-25 – 2024-11-28 (×4): 8.6 mg via ORAL
  Filled 2024-11-25 (×4): qty 1

## 2024-11-25 MED ORDER — BISACODYL 10 MG RE SUPP: 10.0000 mg | Freq: Every day | RECTAL | Status: DC | PRN

## 2024-11-25 MED ORDER — POLYETHYLENE GLYCOL 3350 17 G PO PACK
17.0000 g | PACK | Freq: Two times a day (BID) | ORAL | Status: DC
  Administered 2024-11-25: 17 g via ORAL
  Filled 2024-11-25 (×4): qty 1

## 2024-11-25 NOTE — Care Management Obs Status (Signed)
 MEDICARE OBSERVATION STATUS NOTIFICATION   Patient Details  Name: Samantha Ford MRN: [id number] Date of Birth: 05-16-1941   Medicare Observation Status Notification Given:  Chaney BRANDY CHRISTIANE LELON, CMA 11/25/2024, 2:01 PM

## 2024-11-25 NOTE — Progress Notes (Signed)
 [facility] Progress Note  Patient: Samantha Ford    FMW:[id number]  DOA: 11/24/2024     Date of Service: the patient was seen and examined on 11/25/2024  Chief Complaint  Patient presents with   Back Pain   Brief hospital course: Samantha Ford is a pleasant 83 y.o. female with medical history significant for HLD, osteoarthritis, GERD, history breast caner s/p mastectomy in 2015 who came into ED at [facility] for low back pain started Monday 3 days ago.   Patient complains that her pain is in the lower back, sharp, excruciating, 10/10 in intensity, nonradiating, improved with morphine  but did not resolve the pain completely.  She also complained of lower abdominal discomfort.  She denies any history of trauma, fever, chills, nausea, vomiting, diarrhea.  She denies any dysuria, hematuria, hematemesis, melena.  She denies any chest pain, shortness of breath, palpitations.  However, she endorsed that she had a fall a month ago at her son's house from the commode and hurt her head but did not hit her back.   ED Course: Upon arrival to the ED, patient is found to be in severe low back pain, CT of the abdomen was done which showed no intra-abdominal pathology, unfortunately CT scan showed finding concerning for multiple diffuse spinal compression fractures that appear pathological in nature between T7 and L4.  MRI of the thoracolumbar spine was done which showed T5-T12 and L1-L4 compression fractures likely pathological.  Neurosurgical service was consulted.  Patient was started on IV pain medications with morphine , IV Zofran , IV fluid with "Ringer's"  lactate.  Hospitalist service was consulted for evaluation for admission.     Assessment and Plan:  # Thoracolumbar pathological compression fracture Unknown cause, could be due to history of breast cancer versus multiple myeloma MRI reviewed, suggested lymphoproliferative disorder, less likely multiple myeloma Continue Tylenol , Norco and Dilaudid  IV prn   Hold off steroids for now, pending biopsy on Monday May start gabapentin or Lyrica if neuropathic pain Seen by neurosurgery, recommended TLSO brace and pain management. Consulted oncologist, recommended bone marrow biopsy Consulted IR, bone marrow biopsy will be done on Monday.    # HLD/GERD - Resume home medications   # Hypokalemia: Repleted and resolved # Hyponatremia, mild.  Resolved Monitor electrolytes daily and replete as needed.   Body mass index is 22.31 kg/m.  Interventions:   Diet: Regular diet DVT Prophylaxis: Subcutaneous Lovenox    Advance goals of care discussion: Full code  Family Communication: family was present at bedside, at the time of interview.  The pt provided permission to discuss medical plan with the family. Opportunity was given to ask question and all questions were answered satisfactorily.   Disposition:  Pt is from Home, admitted with thoracolumbar compression, malignancy workup in progress, scheduled for bone marrow biopsy on Monday, 12/8 by IR, which precludes a safe discharge. Discharge to home with home health versus SNF, when stable, most likely after biopsy done by IR which is scheduled on Monday, 12/8.  Subjective: No significant events overnight.  Patient is still having back pain 7/10, reports slept well last night. Patient did ask for pain medication which she received by RN. Denied any other active issues.   Physical Exam: General: NAD, lying comfortably Appear in no distress, affect appropriate Eyes: PERRLA ENT: Oral Mucosa Clear, moist  Neck: no JVD,  Cardiovascular: S1 and S2 Present, no Murmur,  Respiratory: good respiratory effort, Bilateral Air entry equal and Decreased, no Crackles, no wheezes Abdomen: Bowel Sound present, Soft and  no tenderness,  Skin: no rashes Extremities: no Pedal edema, no calf tenderness Neurologic: without any new focal findings Gait not checked due to patient safety concerns  Vitals:    11/24/24 1735 11/25/24 0504 11/25/24 0729 11/25/24 1601  BP: (!) 158/70 130/74 126/62 109/65  Pulse: 85 (!) 104 96 78  Resp: 16 17 18 17   Temp: (!) 97.5 F (36.4 C) 98.1 F (36.7 C) 98.9 F (37.2 C) 98.2 F (36.8 C)  TempSrc:      SpO2: 92% 97% 93% 93%  Weight:      Height:        Intake/Output Summary (Last 24 hours) at 11/25/2024 1621 Last data filed at 11/25/2024 1300 Gross per 24 hour  Intake 0 ml  Output --  Net 0 ml   Filed Weights   11/24/24 0155  Weight: 62.7 kg    Data Reviewed: I have personally reviewed and interpreted daily labs, tele strips, imagings as discussed above. I reviewed all nursing notes, pharmacy notes, vitals, pertinent old records I have discussed plan of care as described above with RN and patient/family.  CBC: Recent Labs  Lab 11/24/24 0247 11/25/24 0536  WBC 4.6 4.5  HGB 10.8* 10.1*  HCT 32.8* 31.4*  MCV 87.0 88.5  PLT 195 220   Basic Metabolic Panel: Recent Labs  Lab 11/24/24 0247 11/25/24 0536  NA 133* 136  K 3.4* 4.4  CL 96* 97*  CO2 23 28  GLUCOSE 98 120*  BUN 11 13  CREATININE 0.69 0.71  CALCIUM 9.5 9.3    Studies: No results found.  Scheduled Meds:  enoxaparin  (LOVENOX ) injection  40 mg Subcutaneous Q24H   pantoprazole   40 mg Oral Daily   senna  1 tablet Oral Daily   traZODone   50 mg Oral QHS   Continuous Infusions: PRN Meds: acetaminophen  **OR** acetaminophen , HYDROcodone -acetaminophen , HYDROmorphone  (DILAUDID ) injection, ondansetron  **OR** ondansetron  (ZOFRAN ) IV, polyethylene glycol  Time spent: 55 minutes  Author: ELVAN SOR. MD [facility] 11/25/2024 4:21 PM  To reach On-call, see care teams to locate the attending and reach out to them via [email or web address]. If 7PM-7AM, please contact night-coverage If you still have difficulty reaching the attending provider, please page the [facility] (Director on Call) for [facility] on amion for assistance.

## 2024-11-25 NOTE — Consult Note (Signed)
 [facility]  [phone]) [phone] [phone]  ID: Dois Dec OB: [birth year]  MR#: [id number]  RDW#:[id number]  Patient Care Team: Diedra Lame, MD as PCP - General (Family Medicine)  CHIEF COMPLAINT: Multiple bony lesions, concerning for multiple myeloma.  INTERVAL HISTORY: Patient is an 83 year old female with a distant history of lobular breast cancer who recently presented to the emergency room with worsening back pain.  Patient reports she has had significant amount of pain for several months, but was able to tolerate it.  She otherwise has felt well.  She has no neurologic complaints.  She denies any recent fevers.  She has a good appetite and denies weight loss.  She has no chest pain, shortness of breath, cough, or hemoptysis.  She denies any nausea, vomiting, constipation, or diarrhea.  She has no urinary complaints.  Patient offers no further specific complaints today.  REVIEW OF SYSTEMS:   Review of Systems  Constitutional: Negative.  Negative for fever, malaise/fatigue and weight loss.  Respiratory: Negative.  Negative for cough, hemoptysis and shortness of breath.   Cardiovascular: Negative.  Negative for chest pain and leg swelling.  Gastrointestinal: Negative.  Negative for abdominal pain.  Genitourinary: Negative.  Negative for dysuria.  Musculoskeletal:  Positive for back pain.  Skin: Negative.  Negative for rash.  Neurological: Negative.  Negative for dizziness, focal weakness, weakness and headaches.  Psychiatric/Behavioral: Negative.  The patient is not nervous/anxious.     As per HPI. Otherwise, a complete review of systems is negative.  PAST MEDICAL HISTORY: Past Medical History:  Diagnosis Date   Breast cancer (HCC) 10/22/2013   Mastectomy in 2015   Cancer [facility])    right breast 12/06/2013   GERD (gastroesophageal reflux disease)    Hyperlipidemia    Insomnia    Macular degeneration of right eye    Osteoarthritis     Pneumonia    Pneumothorax     PAST SURGICAL HISTORY: Past Surgical History:  Procedure Laterality Date   ANKLE FRACTURE SURGERY Right 09/30/2010   BREAST BIOPSY Left 11/29/2015   neg, ribbon clip   BREAST BIOPSY Left 11/29/2015   neg, coil clip    KNEE ARTHROSCOPY Right 04/04/2008   MASTECTOMY Right 12/29/2013   PARTIAL MASTECTOMY WITH AXILLARY SENTINEL LYMPH NODE BIOPSY Right 12/06/2013   REVERSE SHOULDER ARTHROPLASTY Left 08/06/2023   Procedure: REVERSE SHOULDER ARTHROPLASTY WITH BICEPS TENODESIS.;  Surgeon: Edie Norleen PARAS, MD;  Location: [facility] ORS;  Service: Orthopedics;  Laterality: Left;    FAMILY HISTORY: Family History  Problem Relation Age of Onset   Breast cancer Neg Hx     ADVANCED DIRECTIVES (Y/N):  @ADVDIR @  HEALTH MAINTENANCE: Social History   Tobacco Use   Smoking status: Never   Smokeless tobacco: Never  Vaping Use   Vaping status: Never Used  Substance Use Topics   Alcohol use: Not Currently   Drug use: Never     Colonoscopy:  PAP:  Bone density:  Lipid panel:  No Known Allergies  Current Facility-Administered Medications  Medication Dose Route Frequency Provider Last Rate Last Admin   acetaminophen  (TYLENOL ) tablet 650 mg  650 mg Oral Q6H PRN Duncan, Hazel V, MD   650 mg at 11/25/24 0835   Or   acetaminophen  (TYLENOL ) suppository 650 mg  650 mg Rectal Q6H PRN Cleatus Delayne GAILS, MD       enoxaparin  (LOVENOX ) injection 40 mg  40 mg Subcutaneous Q24H Paudel, Keshab, MD   40 mg at 11/25/24 [phone]  HYDROcodone -acetaminophen  (NORCO/VICODIN) 5-325 MG per tablet 1-2 tablet  1-2 tablet Oral Q4H PRN Duncan, Hazel V, MD   2 tablet at 11/25/24 1523   HYDROmorphone  (DILAUDID ) injection 1 mg  1 mg Intravenous Q4H PRN Paudel, Keshab, MD   1 mg at 11/25/24 1039   ondansetron  (ZOFRAN ) tablet 4 mg  4 mg Oral Q6H PRN Duncan, Hazel V, MD       Or   ondansetron  (ZOFRAN ) injection 4 mg  4 mg Intravenous Q6H PRN Duncan, Hazel V, MD   4 mg at 11/25/24 0456   pantoprazole   (PROTONIX ) EC tablet 40 mg  40 mg Oral Daily Von Bellis, MD   40 mg at 11/25/24 9163   polyethylene glycol (MIRALAX  / GLYCOLAX ) packet 17 g  17 g Oral Daily PRN Paudel, Keshab, MD       senna (SENOKOT) tablet 8.6 mg  1 tablet Oral Daily Borders, Joshua R, NP   8.6 mg at 11/25/24 1527   traZODone  (DESYREL ) tablet 50 mg  50 mg Oral QHS Paudel, Keshab, MD   50 mg at 11/24/24 2027    OBJECTIVE: Vitals:   11/25/24 0504 11/25/24 0729  BP: 130/74 126/62  Pulse: (!) 104 96  Resp: 17 18  Temp: 98.1 F (36.7 C) 98.9 F (37.2 C)  SpO2: 97% 93%     Body mass index is 22.31 kg/m.    ECOG FS:1 - Symptomatic but completely ambulatory  General: Well-developed, well-nourished, no acute distress. Eyes: Pink conjunctiva, anicteric sclera. HEENT: Normocephalic, moist mucous membranes. Lungs: No audible wheezing or coughing. Heart: Regular rate and rhythm. Abdomen: Soft, nontender, no obvious distention. Musculoskeletal: No edema, cyanosis, or clubbing. Neuro: Alert, answering all questions appropriately. Cranial nerves grossly intact. Skin: No rashes or petechiae noted. Psych: Normal affect. Lymphatics: No cervical, calvicular, axillary or inguinal LAD.   LAB RESULTS:  Lab Results  Component Value Date   NA 136 11/25/2024   K 4.4 11/25/2024   CL 97 (L) 11/25/2024   CO2 28 11/25/2024   GLUCOSE 120 (H) 11/25/2024   BUN 13 11/25/2024   CREATININE 0.71 11/25/2024   CALCIUM 9.3 11/25/2024   PROT 7.1 11/25/2024   ALBUMIN 3.9 11/25/2024   AST 28 11/25/2024   ALT 8 11/25/2024   ALKPHOS 108 11/25/2024   BILITOT 0.3 11/25/2024   GFRNONAA >60 11/25/2024    Lab Results  Component Value Date   WBC 4.5 11/25/2024   NEUTROABS 2.8 05/19/2024   HGB 10.1 (L) 11/25/2024   HCT 31.4 (L) 11/25/2024   MCV 88.5 11/25/2024   PLT 220 11/25/2024     STUDIES: CT Chest W Contrast Result Date: 11/24/2024 EXAM: CT CHEST WITH CONTRAST 11/24/2024 07:40:35 AM TECHNIQUE: CT of the chest was performed  with the administration of 75 mL of iohexol  (OMNIPAQUE ) 300 MG/ML solution. Multiplanar reformatted images are provided for review. Automated exposure control, iterative reconstruction, and/or weight based adjustment of the mA/kV was utilized to reduce the radiation dose to as low as reasonably achievable. COMPARISON: CT abdomen and pelvis, thoracic and lumbar spine MRI reported separately today. CLINICAL HISTORY: 83 year old female with malignancy, unknown primary, and pathologic spine fractures. FINDINGS: MEDIASTINUM: Calcified coronary artery and aortic atherosclerosis. Heart size within normal limits. No pericardial effusion. The central airways are clear. Central pulmonary arteries appear to be normally enhancing. Moderate sized gastric hiatal hernia. Elevated right hemidiaphragm. LYMPH NODES: No mediastinal, hilar or axillary lymphadenopathy. LUNGS AND PLEURA: Evidence of centrilobular emphysema, mild in the upper lobes. Bilateral lung  base mild bronchiectasis, chronic atelectasis and scarring in part related to chronic hiatal hernia and chronic elevation of the right hemidiaphragm. No active lung inflammation. No pleural effusion or pneumothorax. SOFT TISSUES/BONES: Diffusely abnormal bone mineralization throughout the thoracic spine with numerous thoracic compression fractures, detailed on MRI separately today. There is a mild degree of superimposed thoracic spinal hyperostosis with occasional interbody ankylosis (T3-T4, T9-T10). Heterogeneous bilateral ribs. No acute or pathologic rib fracture is identified. Streak artifact from left shoulder arthroplasty. UPPER ABDOMEN: Visible upper abdominal viscera are stable from CT abdomen and pelvis reported separately today. IMPRESSION: 1. Abnormal thoracic spine as detailed on MRI separately today. Heterogeneous ribs, but no definite acute or pathologic rib fracture identified. 2. No superimposed acute or malignant process identified in the lungs or mediastinum. 3.  Lung scarring and atelectasis with a mild degree of underlying Emphysema. Aortic and coronary artery atherosclerosis. Moderate gastric hiatal hernia and chronic elevated right hemidiaphragm. Electronically signed by: Helayne Hurst MD 11/24/2024 09:09 AM EST RP Workstation: [id number]   MR Lumbar Spine W Wo Contrast Result Date: 11/24/2024 EXAM: MRI LUMBAR SPINE 11/24/2024 07:11:00 AM TECHNIQUE: Multiplanar multisequence MRI of the lumbar spine was performed without and with the administration of intravenous contrast. 6 mL (gadobutrol  (GADAVIST ) 1 MMOL/ML injection 6 mL GADOBUTROL  1 MMOL/ML IV SOLN). COMPARISON: Thoracic MRI 11/24/2024 reported separately. CT abdomen and pelvis 11/24/2024 reported separately. CLINICAL HISTORY: 83 year old female with new widespread spinal compression fractures and diffuse heterogeneous skeletal mineralization on CT this morning. FINDINGS: Normal lumbar segmentation, concordant with thoracic numbering today. BONES AND ALIGNMENT: Diffusely heterogeneous T1 marrow signal throughout the visible spine. Heterogeneous marrow in the sacrum and pelvis although less affected (series 13 image 39). Lower thoracic spinal compression fractures redemonstrated. Lumbar spinal compression fractures range from the L1 level (74% loss of vertebral body height) to the L2 level (4% loss of vertebral body height). All levels including the central sacrum demonstrate diffuse increased STIR hyperintensity and heterogeneous hyperenhancement following contrast (series 14 image 6). Sacrum appears grossly intact. Retropulsion of the L1 posterior inferior endplate results in mild spinal stenosis seen on series 12 image 12. No extraosseous extension of tumor or soft tissue is identified in the lumbar spine. SPINAL CORD: Normal conus medullaris at L1. No signal abnormality in the visible lower thoracic spinal cord or conus. No abnormal intradural enhancement. No dural thickening. Normal cauda equina nerve roots.  SOFT TISSUES: No paraspinal mass. Stable visible abdominal viscera. DEGENERATIVE: Pathologic compression fracture related mild spinal stenosis at the L1 level. Otherwise ordinary lumbar spine degeneration including disc bulging and endplate spurring. No high grade lumbar spine stenosis. There is moderate lumbar neural foraminal stenosis at the right L1 and L4 nerve levels. IMPRESSION: 1. Diffuse abnormal marrow signal throughout the spine, as seen on Thoracic MRI. Sacrum and pelvis are less affected. Consider metastatic disease, lymphoproliferative disorder, or less likely multiple myeloma (given heterogeneous sclerosis by CT). 2. Lumbar compression fractures with loss of height ranging from 74% (L1 ) to 4% (L2). Mild spinal and moderate right neural foraminal stenosis at L1 due to retropulsion. No extraosseous tumor or soft tissue. 3. Negative conus medullaris and cauda equina. Electronically signed by: Helayne Hurst MD 11/24/2024 07:33 AM EST RP Workstation: [id number]   MR THORACIC SPINE W WO CONTRAST Result Date: 11/24/2024 EXAM: MRI THORACIC SPINE WITH AND WITHOUT INTRAVENOUS CONTRAST 11/24/2024 07:11:00 AM TECHNIQUE: Multiplanar multisequence MRI of the thoracic spine was performed with and without the administration of intravenous contrast. COMPARISON: CT abdomen and  pelvis 11/24/2024. CLINICAL HISTORY: 83 year old female with diffusely abnormal skeletal bone mineralization and diffuse thoracic and lumbar compression fractures, new from May. FINDINGS: Limited sagittal imaging of the cervical spine demonstrates diffuse abnormality T1 hypointense marrow replacement, only partially sparing the anterior C2 vertebra (series 7 image 3). Thoracic segmentation appears to be normal. BONES AND ALIGNMENT: Heterogeneous diffusely abnormal T1 marrow signal throughout the thoracic vertebrae. Diffuse increased STIR signal within the thoracic vertebrae, including the levels T1 through T4 which appear to remain intact. From  T5 through T12 there are thoracic compression fractures with loss of height ranging from T5 level (6% loss of vertebral body height) to the T7 level (48% loss of vertebral body height). No significant thoracic vertebral retropulsion. Also on sagittal STIR images there is a discrete round T6 vertebral body lesion located posteriorly on the right with intense increased STIR signal (series 9 image 7) but is relatively hypoenhancing following contrast. Similar signal abnormality in the T6 lamina and spinous process there (series 9 image 9). Smaller T11 round 11 mm right posterior vertebral body lesion is heterogeneously enhancing on series 14 image 7. Similar rounded lower cervical vertebral lesions are partially visible such as at C7 on the left (series 9 image 10). Visible posterior ribs are also diffusely heterogeneous. There is no extraosseous tumor or soft tissue extension identified in the thoracic spine. SPINAL CORD: Normal thoracic spinal cord signal and morphology. No abnormal intradural enhancement. No dural thickening. SOFT TISSUES: Negative visible soft tissues at the thoracic inlet, mediastinum. Stable visible upper abdominal viscera. DEGENERATIVE CHANGES: Thoracic spinal canal is capacious. Mild for age thoracic spine disc degeneration including a small central disc protrusion at T2-T3 (series 10 image 14). No thoracic spinal stenosis. IMPRESSION: 1. Widespread thoracic pathologic compression fractures: T5 through T12 with loss of height ranging from 6% to 48%. No retropulsion, extraosseous tumor, or other complicating features. 2. Underlying Diffuse abnormal spinal and rib marrow signal compatible with an infiltrative marrow process such as: metastatic disease, lymphoproliferative disorder, or less likely multiple myeloma (given heterogeneous sclerosis by CT). Multiple small, round and discrete STIR hyperintense vertebral lesions are noted, although not hyperenhancing. 3. No thoracic spinal stenosis.  Normal thoracic spinal cord. Electronically signed by: Helayne Hurst MD 11/24/2024 07:24 AM EST RP Workstation: [id number]   CT ABDOMEN PELVIS W CONTRAST Result Date: 11/24/2024 EXAM: CT ABDOMEN AND PELVIS WITH CONTRAST 11/24/2024 04:55:30 AM TECHNIQUE: CT of the abdomen and pelvis was performed with the administration of 100 mL of iohexol  (OMNIPAQUE ) 300 MG/ML solution. Multiplanar reformatted images are provided for review. Automated exposure control, iterative reconstruction, and/or weight-based adjustment of the mA/kV was utilized to reduce the radiation dose to as low as reasonably achievable. COMPARISON: CT abdomen and pelvis 05/19/2024. CLINICAL HISTORY: 83 year old female. LLQ abdominal pain. FINDINGS: LOWER CHEST: No acute abnormality. HEART AND MEDIASTINUM: Advanced left coronary artery calcified atherosclerosis or stent on series 2 image 2. Stable heart size, upper limits of normal. No pericardial effusion. PLEURAL SPACES: No pleural effusion. LUNGS: Chronic elevation of the right hemidiaphragm. Chronic bilateral lung base atelectasis. LIVER: The liver is unremarkable. GALLBLADDER AND BILE DUCTS: Gallbladder is unremarkable. No biliary ductal dilatation. SPLEEN: No acute abnormality. PANCREAS: No acute abnormality. ADRENAL GLANDS: No acute abnormality. KIDNEYS, URETERS AND BLADDER: Renal parenchymal volume appears stable since May. Renal enhancement and contrast excretion appears symmetric and normal. No stones in the kidneys or ureters. No hydronephrosis. No perinephric or periureteral stranding. Urinary bladder is unremarkable. GI AND BOWEL: Moderate chronic  gastric hiatal hernia. Intraabdominal stomach and duodenum appear negative. Widespread diverticulosis of the distal descent and sigmoid colon with no active inflammation. Less pronounced large bowel diverticula elsewhere. Fluid in the ascending colon to the hepatic flexure. No dilated or inflamed bowel loops. There is no bowel obstruction.  APPENDIX: Normal gas containing appendix on series 2 image 41. PERITONEUM AND RETROPERITONEUM: No ascites. No free air. VASCULATURE: Advanced calcified aortic atherosclerosis. Major arterial structures and portal venous system remain patent. ILIOFEMORAL ARTERIES: Less pronounced iliofemoral calcified atherosclerosis. LYMPH NODES: No lymphadenopathy. REPRODUCTIVE ORGANS: No acute abnormality. BONES AND SOFT TISSUES: Pronounced abnormal bone mineralization since May this year. At that time, L1 compression fracture was acute/subacute. There are now extensive lower thoracic and lumbar spinal compression fractures from the visible T7 through L4 levels, with associated bone mineralization heterogeneity, including heterogeneous sclerosis throughout the visible ribs and pelvis, which seem to remain intact. Retropulsion of bone at L1 results in mild spinal stenosis (sagittal image 53). No discrete destructive osseous lesion. Marked change in diffuse skeletal bone mineralization since May, and diffuse spinal compression fractures since that time. Constellation highly suspicious for a pathologic marrow infiltrative process. No discrete destructive bone lesion, but metastatic disease, myelofibrosis, or similar etiology. No paraspinal soft tissue mass. No focal soft tissue abnormality. IMPRESSION: 1. Marked change in the skeletal bone mineralization since May, with diffuse spinal compression fractures since that time. Constellation highly suspicious for a pathologic marrow infiltrative process. Although no discrete destructive bone lesion, consider Metastatic disease, Myelofibrosis, or similar etiology. 2. Mild retropulsion of L1 resulting in mild spinal stenosis. 3. No superimposed acute or metastatic soft tissue abnormality identified in the abdomen or pelvis. Chronic aortic atherosclerosis, coronary artery atherosclerosis , moderate gastric hiatal hernia. 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  Case also discussed with radiology who feels lesions are more likely consistent with myeloma rather than metastatic disease.  Have ordered myeloma workup for further evaluation.  Patient will also be scheduled for bone marrow biopsy on Monday, November 28, 2024. Distant history of breast cancer: Patient underwent mastectomy in approximately 2015.  She did not have any chemotherapy or XRT.  Aromatase inhibitor was offered at that time, but patient declined.  Have ordered a CA 27-29 for completeness. Pain: Appreciate palliative care input.  Continue narcotic regimen as ordered. Anemia: Mild, monitor.  Patient's hemoglobin is 10.1.  Appreciate consult, will follow.  Evalene JINNY Reusing, MD   11/25/2024 3:45 PM

## 2024-11-25 NOTE — Plan of Care (Signed)

## 2024-11-25 NOTE — Consult Note (Signed)
 Palliative Medicine [facility] [phone] [phone]   Name: Samantha Ford Date: 11/25/2024 MRN: [id number]  DOB: 09-Jul-1941  Patient Care Team: Diedra Lame, MD as PCP - General (Family Medicine)    REASON FOR CONSULTATION: Samantha Ford is a 83 y.o. female with multiple medical problems including OA, remote history of breast cancer status postmastectomy in 2015, who presented to the emergency department with complaint of low back pain.  CT of the abdomen showed multiple diffuse spinal compression pathologic fractures.  Palliative care was consulted to address goals and manage ongoing symptoms.  SOCIAL HISTORY:     reports that she has never smoked. She has never used smokeless tobacco. She reports that she does not currently use alcohol. She reports that she does not use drugs.  Patient is widowed.  Lives at home with her grandson.  She has 2 sons and multiple grandchildren who live nearby.  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  ADVANCE DIRECTIVES:  Does not have  CODE STATUS: Full code  PAST MEDICAL HISTORY: Past Medical History:  Diagnosis Date   Breast cancer (HCC) 10/22/2013   Mastectomy in 2015   Cancer (HCC)    right breast 12/06/2013   GERD (gastroesophageal reflux disease)    Hyperlipidemia    Insomnia    Macular degeneration of right eye    Osteoarthritis    Pneumonia    Pneumothorax     PAST SURGICAL HISTORY:  Past Surgical History:  Procedure Laterality Date   ANKLE FRACTURE SURGERY Right 09/30/2010   BREAST BIOPSY Left 11/29/2015   neg, ribbon clip   BREAST BIOPSY Left 11/29/2015   neg, coil clip    KNEE ARTHROSCOPY Right 04/04/2008   MASTECTOMY Right 12/29/2013   PARTIAL MASTECTOMY WITH AXILLARY SENTINEL LYMPH NODE BIOPSY Right 12/06/2013   REVERSE SHOULDER ARTHROPLASTY Left 08/06/2023   Procedure: REVERSE SHOULDER ARTHROPLASTY WITH BICEPS  TENODESIS.;  Surgeon: Edie Norleen PARAS, MD;  Location: [facility] ORS;  Service: Orthopedics;  Laterality: Left;    HEMATOLOGY/ONCOLOGY HISTORY:  Oncology History   No history exists.    ALLERGIES:  has no known allergies.  MEDICATIONS:  Current Facility-Administered Medications  Medication Dose Route Frequency Provider Last Rate Last Admin   acetaminophen  (TYLENOL ) tablet 650 mg  650 mg Oral Q6H PRN Duncan, Hazel V, MD   650 mg at 11/25/24 9164   Or   acetaminophen  (TYLENOL ) suppository 650 mg  650 mg Rectal Q6H PRN Duncan, Hazel V, MD       enoxaparin  (LOVENOX ) injection 40 mg  40 mg Subcutaneous Q24H Paudel, Keshab, MD   40 mg at 11/25/24 0836   HYDROcodone -acetaminophen  (NORCO/VICODIN) 5-325 MG per tablet 1-2 tablet  1-2 tablet Oral Q4H PRN Duncan, Hazel V, MD   2 tablet at 11/24/24 1352   HYDROmorphone  (DILAUDID ) injection 1 mg  1 mg Intravenous Q4H PRN Paudel, Keshab, MD   1 mg at 11/25/24 1039   ondansetron  (ZOFRAN ) tablet 4 mg  4 mg Oral Q6H PRN Duncan, Hazel V, MD       Or   ondansetron  (ZOFRAN ) injection 4 mg  4 mg Intravenous Q6H PRN Duncan, Hazel V, MD   4 mg at 11/25/24 0456   pantoprazole  (PROTONIX ) EC tablet 40 mg  40 mg Oral Daily Von Bellis, MD   40 mg at 11/25/24 0836   polyethylene glycol (MIRALAX  / GLYCOLAX ) packet 17 g  17 g Oral Daily PRN  Paudel, Keshab, MD       traZODone  (DESYREL ) tablet 50 mg  50 mg Oral QHS Paudel, Keshab, MD   50 mg at 11/24/24 2027    VITAL SIGNS: BP 126/62 (BP Location: Left Arm)   Pulse 96   Temp 98.9 F (37.2 C)   Resp 18   Ht 5' 6 (1.676 m)   Wt 138 lb 3.2 oz (62.7 kg)   SpO2 93%   BMI 22.31 kg/m  Filed Weights   11/24/24 0155  Weight: 138 lb 3.2 oz (62.7 kg)    Estimated body mass index is 22.31 kg/m as calculated from the following:   Height as of this encounter: 5' 6 (1.676 m).   Weight as of this encounter: 138 lb 3.2 oz (62.7 kg).  LABS: CBC:    Component Value Date/Time   WBC 4.5 11/25/2024 0536   HGB 10.1 (L)  11/25/2024 0536   HCT 31.4 (L) 11/25/2024 0536   PLT 220 11/25/2024 0536   MCV 88.5 11/25/2024 0536   NEUTROABS 2.8 05/19/2024 0643   LYMPHSABS 1.2 05/19/2024 0643   MONOABS 0.4 05/19/2024 0643   EOSABS 0.2 05/19/2024 0643   BASOSABS 0.0 05/19/2024 0643   Comprehensive Metabolic Panel:    Component Value Date/Time   NA 136 11/25/2024 0536   K 4.4 11/25/2024 0536   CL 97 (L) 11/25/2024 0536   CO2 28 11/25/2024 0536   BUN 13 11/25/2024 0536   CREATININE 0.71 11/25/2024 0536   GLUCOSE 120 (H) 11/25/2024 0536   CALCIUM 9.3 11/25/2024 0536   AST 28 11/25/2024 0536   ALT 8 11/25/2024 0536   ALKPHOS 108 11/25/2024 0536   BILITOT 0.3 11/25/2024 0536   PROT 7.1 11/25/2024 0536   ALBUMIN 3.9 11/25/2024 0536    RADIOGRAPHIC STUDIES: CT Chest W Contrast Result Date: 11/24/2024 EXAM: CT CHEST WITH CONTRAST 11/24/2024 07:40:35 AM TECHNIQUE: CT of the chest was performed with the administration of 75 mL of iohexol  (OMNIPAQUE ) 300 MG/ML solution. Multiplanar reformatted images are provided for review. Automated exposure control, iterative reconstruction, and/or weight based adjustment of the mA/kV was utilized to reduce the radiation dose to as low as reasonably achievable. COMPARISON: CT abdomen and pelvis, thoracic and lumbar spine MRI reported separately today. CLINICAL HISTORY: 83 year old female with malignancy, unknown primary, and pathologic spine fractures. FINDINGS: MEDIASTINUM: Calcified coronary artery and aortic atherosclerosis. Heart size within normal limits. No pericardial effusion. The central airways are clear. Central pulmonary arteries appear to be normally enhancing. Moderate sized gastric hiatal hernia. Elevated right hemidiaphragm. LYMPH NODES: No mediastinal, hilar or axillary lymphadenopathy. LUNGS AND PLEURA: Evidence of centrilobular emphysema, mild in the upper lobes. Bilateral lung base mild bronchiectasis, chronic atelectasis and scarring in part related to chronic hiatal  hernia and chronic elevation of the right hemidiaphragm. No active lung inflammation. No pleural effusion or pneumothorax. SOFT TISSUES/BONES: Diffusely abnormal bone mineralization throughout the thoracic spine with numerous thoracic compression fractures, detailed on MRI separately today. There is a mild degree of superimposed thoracic spinal hyperostosis with occasional interbody ankylosis (T3-T4, T9-T10). Heterogeneous bilateral ribs. No acute or pathologic rib fracture is identified. Streak artifact from left shoulder arthroplasty. UPPER ABDOMEN: Visible upper abdominal viscera are stable from CT abdomen and pelvis reported separately today. IMPRESSION: 1. Abnormal thoracic spine as detailed on MRI separately today. Heterogeneous ribs, but no definite acute or pathologic rib fracture identified. 2. No superimposed acute or malignant process identified in the lungs or mediastinum. 3. Lung scarring and atelectasis  with a mild degree of underlying Emphysema. Aortic and coronary artery atherosclerosis. Moderate gastric hiatal hernia and chronic elevated right hemidiaphragm. Electronically signed by: Helayne Hurst MD 11/24/2024 09:09 AM EST RP Workstation: [id number]   MR Lumbar Spine W Wo Contrast Result Date: 11/24/2024 EXAM: MRI LUMBAR SPINE 11/24/2024 07:11:00 AM TECHNIQUE: Multiplanar multisequence MRI of the lumbar spine was performed without and with the administration of intravenous contrast. 6 mL (gadobutrol  (GADAVIST ) 1 MMOL/ML injection 6 mL GADOBUTROL  1 MMOL/ML IV SOLN). COMPARISON: Thoracic MRI 11/24/2024 reported separately. CT abdomen and pelvis 11/24/2024 reported separately. CLINICAL HISTORY: 83 year old female with new widespread spinal compression fractures and diffuse heterogeneous skeletal mineralization on CT this morning. FINDINGS: Normal lumbar segmentation, concordant with thoracic numbering today. BONES AND ALIGNMENT: Diffusely heterogeneous T1 marrow signal throughout the visible spine.  Heterogeneous marrow in the sacrum and pelvis although less affected (series 13 image 39). Lower thoracic spinal compression fractures redemonstrated. Lumbar spinal compression fractures range from the L1 level (74% loss of vertebral body height) to the L2 level (4% loss of vertebral body height). All levels including the central sacrum demonstrate diffuse increased STIR hyperintensity and heterogeneous hyperenhancement following contrast (series 14 image 6). Sacrum appears grossly intact. Retropulsion of the L1 posterior inferior endplate results in mild spinal stenosis seen on series 12 image 12. No extraosseous extension of tumor or soft tissue is identified in the lumbar spine. SPINAL CORD: Normal conus medullaris at L1. No signal abnormality in the visible lower thoracic spinal cord or conus. No abnormal intradural enhancement. No dural thickening. Normal cauda equina nerve roots. SOFT TISSUES: No paraspinal mass. Stable visible abdominal viscera. DEGENERATIVE: Pathologic compression fracture related mild spinal stenosis at the L1 level. Otherwise ordinary lumbar spine degeneration including disc bulging and endplate spurring. No high grade lumbar spine stenosis. There is moderate lumbar neural foraminal stenosis at the right L1 and L4 nerve levels. IMPRESSION: 1. Diffuse abnormal marrow signal throughout the spine, as seen on Thoracic MRI. Sacrum and pelvis are less affected. Consider metastatic disease, lymphoproliferative disorder, or less likely multiple myeloma (given heterogeneous sclerosis by CT). 2. Lumbar compression fractures with loss of height ranging from 74% (L1 ) to 4% (L2). Mild spinal and moderate right neural foraminal stenosis at L1 due to retropulsion. No extraosseous tumor or soft tissue. 3. Negative conus medullaris and cauda equina. Electronically signed by: Helayne Hurst MD 11/24/2024 07:33 AM EST RP Workstation: [id number]   MR THORACIC SPINE W WO CONTRAST Result Date: 11/24/2024 EXAM:  MRI THORACIC SPINE WITH AND WITHOUT INTRAVENOUS CONTRAST 11/24/2024 07:11:00 AM TECHNIQUE: Multiplanar multisequence MRI of the thoracic spine was performed with and without the administration of intravenous contrast. COMPARISON: CT abdomen and pelvis 11/24/2024. CLINICAL HISTORY: 83 year old female with diffusely abnormal skeletal bone mineralization and diffuse thoracic and lumbar compression fractures, new from May. FINDINGS: Limited sagittal imaging of the cervical spine demonstrates diffuse abnormality T1 hypointense marrow replacement, only partially sparing the anterior C2 vertebra (series 7 image 3). Thoracic segmentation appears to be normal. BONES AND ALIGNMENT: Heterogeneous diffusely abnormal T1 marrow signal throughout the thoracic vertebrae. Diffuse increased STIR signal within the thoracic vertebrae, including the levels T1 through T4 which appear to remain intact. From T5 through T12 there are thoracic compression fractures with loss of height ranging from T5 level (6% loss of vertebral body height) to the T7 level (48% loss of vertebral body height). No significant thoracic vertebral retropulsion. Also on sagittal STIR images there is a discrete round T6 vertebral body  lesion located posteriorly on the right with intense increased STIR signal (series 9 image 7) but is relatively hypoenhancing following contrast. Similar signal abnormality in the T6 lamina and spinous process there (series 9 image 9). Smaller T11 round 11 mm right posterior vertebral body lesion is heterogeneously enhancing on series 14 image 7. Similar rounded lower cervical vertebral lesions are partially visible such as at C7 on the left (series 9 image 10). Visible posterior ribs are also diffusely heterogeneous. There is no extraosseous tumor or soft tissue extension identified in the thoracic spine. SPINAL CORD: Normal thoracic spinal cord signal and morphology. No abnormal intradural enhancement. No dural thickening. SOFT  TISSUES: Negative visible soft tissues at the thoracic inlet, mediastinum. Stable visible upper abdominal viscera. DEGENERATIVE CHANGES: Thoracic spinal canal is capacious. Mild for age thoracic spine disc degeneration including a small central disc protrusion at T2-T3 (series 10 image 14). No thoracic spinal stenosis. IMPRESSION: 1. Widespread thoracic pathologic compression fractures: T5 through T12 with loss of height ranging from 6% to 48%. No retropulsion, extraosseous tumor, or other complicating features. 2. Underlying Diffuse abnormal spinal and rib marrow signal compatible with an infiltrative marrow process such as: metastatic disease, lymphoproliferative disorder, or less likely multiple myeloma (given heterogeneous sclerosis by CT). Multiple small, round and discrete STIR hyperintense vertebral lesions are noted, although not hyperenhancing. 3. No thoracic spinal stenosis. Normal thoracic spinal cord. Electronically signed by: Helayne Hurst MD 11/24/2024 07:24 AM EST RP Workstation: [id number]   CT ABDOMEN PELVIS W CONTRAST Result Date: 11/24/2024 EXAM: CT ABDOMEN AND PELVIS WITH CONTRAST 11/24/2024 04:55:30 AM TECHNIQUE: CT of the abdomen and pelvis was performed with the administration of 100 mL of iohexol  (OMNIPAQUE ) 300 MG/ML solution. Multiplanar reformatted images are provided for review. Automated exposure control, iterative reconstruction, and/or weight-based adjustment of the mA/kV was utilized to reduce the radiation dose to as low as reasonably achievable. COMPARISON: CT abdomen and pelvis 05/19/2024. CLINICAL HISTORY: 83 year old female. LLQ abdominal pain. FINDINGS: LOWER CHEST: No acute abnormality. HEART AND MEDIASTINUM: Advanced left coronary artery calcified atherosclerosis or stent on series 2 image 2. Stable heart size, upper limits of normal. No pericardial effusion. PLEURAL SPACES: No pleural effusion. LUNGS: Chronic elevation of the right hemidiaphragm. Chronic bilateral lung base  atelectasis. LIVER: The liver is unremarkable. GALLBLADDER AND BILE DUCTS: Gallbladder is unremarkable. No biliary ductal dilatation. SPLEEN: No acute abnormality. PANCREAS: No acute abnormality. ADRENAL GLANDS: No acute abnormality. KIDNEYS, URETERS AND BLADDER: Renal parenchymal volume appears stable since May. Renal enhancement and contrast excretion appears symmetric and normal. No stones in the kidneys or ureters. No hydronephrosis. No perinephric or periureteral stranding. Urinary bladder is unremarkable. GI AND BOWEL: Moderate chronic gastric hiatal hernia. Intraabdominal stomach and duodenum appear negative. Widespread diverticulosis of the distal descent and sigmoid colon with no active inflammation. Less pronounced large bowel diverticula elsewhere. Fluid in the ascending colon to the hepatic flexure. No dilated or inflamed bowel loops. There is no bowel obstruction. APPENDIX: Normal gas containing appendix on series 2 image 41. PERITONEUM AND RETROPERITONEUM: No ascites. No free air. VASCULATURE: Advanced calcified aortic atherosclerosis. Major arterial structures and portal venous system remain patent. ILIOFEMORAL ARTERIES: Less pronounced iliofemoral calcified atherosclerosis. LYMPH NODES: No lymphadenopathy. REPRODUCTIVE ORGANS: No acute abnormality. BONES AND SOFT TISSUES: Pronounced abnormal bone mineralization since May this year. At that time, L1 compression fracture was acute/subacute. There are now extensive lower thoracic and lumbar spinal compression fractures from the visible T7 through L4 levels, with associated bone mineralization heterogeneity,  including heterogeneous sclerosis throughout the visible ribs and pelvis, which seem to remain intact. Retropulsion of bone at L1 results in mild spinal stenosis (sagittal image 53). No discrete destructive osseous lesion. Marked change in diffuse skeletal bone mineralization since May, and diffuse spinal compression fractures since that time.  Constellation highly suspicious for a pathologic marrow infiltrative process. No discrete destructive bone lesion, but metastatic disease, myelofibrosis, or similar etiology. No paraspinal soft tissue mass. No focal soft tissue abnormality. IMPRESSION: 1. Marked change in the skeletal bone mineralization since May, with diffuse spinal compression fractures since that time. Constellation highly suspicious for a pathologic marrow infiltrative process. Although no discrete destructive bone lesion, consider Metastatic disease, Myelofibrosis, or similar etiology. 2. Mild retropulsion of L1 resulting in mild spinal stenosis. 3. No superimposed acute or metastatic soft tissue abnormality identified in the abdomen or pelvis. Chronic aortic atherosclerosis, coronary artery atherosclerosis , moderate gastric hiatal hernia. Electronically signed by: Helayne Hurst MD 11/24/2024 05:20 AM EST RP Workstation: [id number]    PERFORMANCE STATUS (ECOG) : 1 - Symptomatic but completely ambulatory  Review of Systems Unless otherwise noted, a complete review of systems is negative.  Physical Exam General: NAD Pulmonary: Unlabored Extremities: no edema, no joint deformities Skin: no rashes Neurological: Weakness but otherwise nonfocal  IMPRESSION: Patient with remote history of breast cancer admitted to the hospital with back pain with CT and MRI demonstrating diffuse pathologic compression fractures involving the thoracolumbar spine.  Patient was seen by neurosurgery and placed in a TLSO brace.  Patient reports that pain is stable at present after recently receiving dose of hydromorphone .  In past 24 hours, she has received both Norco and hydromorphone .  Patient finds the hydromorphone  faster acting but states that Norco does help keep the pain tolerable.  She appears to be tolerating pain medications well.  She does endorse several days of constipation.  Will add daily bowel regimen.  Patient recognizes that imaging is  concerning for metastatic process.  She is in agreement with biopsy and then would like to discuss possible treatment options with oncology.  Patient tells me that when she had breast cancer, she refused AI.  She states that she has strong faith and was not fearful of her end-of-life if it was her time.  Patient does not have advance directives but may be interested in establishing those.  She would want her family, including a granddaughter who is an [employer], to be involved in decision making if necessary.  Patient states that she would want to remain a full code for now but makes it clear that she would not want her life prolonged artificially long-term on machines.  Patient is not interested in rehab and would like to return home at time of discharge.  PLAN: - Continue current scope of treatment - Maximize use of Norco and try to wean off IV pain medications in anticipation of eventual discharge home. - Daily bowel regimen to prevent opioid-induced constipation - Outpatient follow-up  Case and plan discussed with Dr. Jacobo   Time Total: 30 minutes  Visit consisted of counseling and education dealing with the complex and emotionally intense issues of symptom management and palliative care in the setting of serious and potentially life-threatening illness.Greater than 50%  of this time was spent counseling and coordinating care related to the above assessment and plan.  Signed by: Fonda Mower, PhD, NP-C

## 2024-11-26 DIAGNOSIS — R52 Pain, unspecified: Secondary | ICD-10-CM | POA: Diagnosis not present

## 2024-11-26 LAB — BASIC METABOLIC PANEL WITH GFR
Anion gap: 11 (ref 5–15)
BUN: 19 mg/dL (ref 8–23)
CO2: 28 mmol/L (ref 22–32)
Calcium: 9.5 mg/dL (ref 8.9–10.3)
Chloride: 98 mmol/L (ref 98–111)
Creatinine, Ser: 1.08 mg/dL — ABNORMAL HIGH (ref 0.44–1.00)
GFR, Estimated: 50 mL/min — ABNORMAL LOW (ref >60)
Glucose, Bld: 167 mg/dL — ABNORMAL HIGH (ref 70–99)
Potassium: 4.2 mmol/L (ref 3.5–5.1)
Sodium: 137 mmol/L (ref 135–145)

## 2024-11-26 LAB — CBC
HCT: 35.1 % — ABNORMAL LOW (ref 36.0–46.0)
Hemoglobin: 10.7 g/dL — ABNORMAL LOW (ref 12.0–15.0)
MCH: 27.7 pg (ref 26.0–34.0)
MCHC: 30.5 g/dL (ref 30.0–36.0)
MCV: 90.9 fL (ref 80.0–100.0)
Platelets: 256 K/uL (ref 150–400)
RBC: 3.86 MIL/uL — ABNORMAL LOW (ref 3.87–5.11)
RDW: 14.7 % (ref 11.5–15.5)
WBC: 6.6 K/uL (ref 4.0–10.5)
nRBC: 0.9 % — ABNORMAL HIGH (ref 0.0–0.2)

## 2024-11-26 LAB — IGG, IGA, IGM
IgA: 294 mg/dL (ref 64–422)
IgG (Immunoglobin G), Serum: 1121 mg/dL (ref 586–1602)
IgM (Immunoglobulin M), Srm: 78 mg/dL (ref 26–217)

## 2024-11-26 LAB — PHOSPHORUS: Phosphorus: 5 mg/dL — ABNORMAL HIGH (ref 2.5–4.6)

## 2024-11-26 LAB — MAGNESIUM: Magnesium: 1.8 mg/dL (ref 1.7–2.4)

## 2024-11-26 NOTE — Plan of Care (Signed)
  Problem: Education: Goal: Knowledge of General Education information will improve Description: Including pain rating scale, medication(s)/side effects and non-pharmacologic comfort measures Outcome: Progressing   Problem: Clinical Measurements: Goal: Diagnostic test results will improve Outcome: Progressing   Problem: Activity: Goal: Risk for activity intolerance will decrease Outcome: Progressing   Problem: Pain Managment: Goal: General experience of comfort will improve and/or be controlled Outcome: Progressing   Problem: Safety: Goal: Ability to remain free from injury will improve Outcome: Progressing

## 2024-11-26 NOTE — Progress Notes (Signed)
 [facility] Progress Note  Patient: Samantha Ford    FMW:[id number]  DOA: 11/24/2024     Date of Service: the patient was seen and examined on 11/26/2024  Chief Complaint  Patient presents with   Back Pain   Brief hospital course: Chaunte Hornbeck is a pleasant 83 y.o. female with medical history significant for HLD, osteoarthritis, GERD, history breast caner s/p mastectomy in 2015 who came into ED at [facility] for low back pain started Monday 3 days ago.   Patient complains that her pain is in the lower back, sharp, excruciating, 10/10 in intensity, nonradiating, improved with morphine  but did not resolve the pain completely.  She also complained of lower abdominal discomfort.  She denies any history of trauma, fever, chills, nausea, vomiting, diarrhea.  She denies any dysuria, hematuria, hematemesis, melena.  She denies any chest pain, shortness of breath, palpitations.  However, she endorsed that she had a fall a month ago at her son's house from the commode and hurt her head but did not hit her back.   ED Course: Upon arrival to the ED, patient is found to be in severe low back pain, CT of the abdomen was done which showed no intra-abdominal pathology, unfortunately CT scan showed finding concerning for multiple diffuse spinal compression fractures that appear pathological in nature between T7 and L4.  MRI of the thoracolumbar spine was done which showed T5-T12 and L1-L4 compression fractures likely pathological.  Neurosurgical service was consulted.  Patient was started on IV pain medications with morphine , IV Zofran , IV fluid with "Ringer's"  lactate.  Hospitalist service was consulted for evaluation for admission.     Assessment and Plan:  # Thoracolumbar pathological compression fracture Unknown cause, could be due to history of breast cancer versus multiple myeloma MRI reviewed, suggested lymphoproliferative disorder, less likely multiple myeloma Continue Tylenol , Norco and Dilaudid  IV prn   Hold off steroids for now, pending biopsy on Monday May start gabapentin or Lyrica if neuropathic pain Seen by neurosurgery, recommended TLSO brace and pain management. Consulted oncologist, recommended bone marrow biopsy Consulted IR, bone marrow biopsy will be done on Monday.    # HLD/GERD - Resume home medications   # Hypokalemia: Repleted and resolved # Hyponatremia, mild.  Resolved Monitor electrolytes daily and replete as needed.   Body mass index is 22.31 kg/m.  Interventions:   Diet: Regular diet DVT Prophylaxis: Subcutaneous Lovenox    Advance goals of care discussion: Full code  Family Communication: family was present at bedside, at the time of interview.  The pt provided permission to discuss medical plan with the family. Opportunity was given to ask question and all questions were answered satisfactorily.   Disposition:  Pt is from Home, admitted with thoracolumbar compression, malignancy workup in progress, scheduled for bone marrow biopsy on Monday, 12/8 by IR, which precludes a safe discharge. Discharge to home with home health versus SNF, when stable, most likely after biopsy done by IR which is scheduled on Monday, 12/8.  Subjective: No significant events overnight.  Patient was wearing TLSO brace and sitting on the recliner, stated that she does not have pain while at rest but while walking it hurts really.  Otherwise no complaints. Patient was concerned of some skin lesions behind right knee and behind her neck.  History of skin cancer, recommended to follow-up with dermatology for biopsy as an outpatient.   Physical Exam: General: NAD, lying comfortably Appear in no distress, affect appropriate Eyes: PERRLA ENT: Oral Mucosa Clear, moist  Neck:  no JVD,  Cardiovascular: S1 and S2 Present, no Murmur,  Respiratory: good respiratory effort, Bilateral Air entry equal and Decreased, no Crackles, no wheezes Abdomen: Bowel Sound present, Soft and no  tenderness,  Skin: no rashes Extremities: no Pedal edema, no calf tenderness Neurologic: without any new focal findings Gait not checked due to patient safety concerns  Vitals:   11/26/24 0523 11/26/24 0723 11/26/24 0917 11/26/24 1122  BP:  (!) 141/83  126/69  Pulse: (!) 110 (!) 107  88  Resp:  17  18  Temp:  98.6 F (37 C)  97.6 F (36.4 C)  TempSrc:      SpO2: 95% 98% 94% 97%  Weight:      Height:       No intake or output data in the 24 hours ending 11/26/24 1348  Filed Weights   11/24/24 0155  Weight: 62.7 kg    Data Reviewed: I have personally reviewed and interpreted daily labs, tele strips, imagings as discussed above. I reviewed all nursing notes, pharmacy notes, vitals, pertinent old records I have discussed plan of care as described above with RN and patient/family.  CBC: Recent Labs  Lab 11/24/24 0247 11/25/24 0536 11/26/24 0503  WBC 4.6 4.5 6.6  HGB 10.8* 10.1* 10.7*  HCT 32.8* 31.4* 35.1*  MCV 87.0 88.5 90.9  PLT 195 220 256   Basic Metabolic Panel: Recent Labs  Lab 11/24/24 0247 11/25/24 0536 11/26/24 0503  NA 133* 136 137  K 3.4* 4.4 4.2  CL 96* 97* 98  CO2 23 28 28   GLUCOSE 98 120* 167*  BUN 11 13 19   CREATININE 0.69 0.71 1.08*  CALCIUM 9.5 9.3 9.5  MG  --   --  1.8  PHOS  --   --  5.0*    Studies: No results found.  Scheduled Meds:  enoxaparin  (LOVENOX ) injection  40 mg Subcutaneous Q24H   pantoprazole   40 mg Oral Daily   polyethylene glycol  17 g Oral BID   senna  1 tablet Oral Daily   traZODone   50 mg Oral QHS   Continuous Infusions: PRN Meds: acetaminophen  **OR** acetaminophen , bisacodyl , HYDROcodone -acetaminophen , HYDROmorphone  (DILAUDID ) injection, ondansetron  **OR** ondansetron  (ZOFRAN ) IV  Time spent: 40 minutes  Author: ELVAN SOR. MD [facility] 11/26/2024 1:48 PM  To reach On-call, see care teams to locate the attending and reach out to them via [email or web address]. If 7PM-7AM, please contact  night-coverage If you still have difficulty reaching the attending provider, please page the [facility] (Director on Call) for [facility] on amion for assistance.

## 2024-11-27 DIAGNOSIS — R52 Pain, unspecified: Secondary | ICD-10-CM | POA: Diagnosis not present

## 2024-11-27 LAB — BASIC METABOLIC PANEL WITH GFR
Anion gap: 11 (ref 5–15)
BUN: 12 mg/dL (ref 8–23)
CO2: 29 mmol/L (ref 22–32)
Calcium: 9.4 mg/dL (ref 8.9–10.3)
Chloride: 97 mmol/L — ABNORMAL LOW (ref 98–111)
Creatinine, Ser: 0.71 mg/dL (ref 0.44–1.00)
GFR, Estimated: >60 mL/min (ref >60)
Glucose, Bld: 113 mg/dL — ABNORMAL HIGH (ref 70–99)
Potassium: 4.1 mmol/L (ref 3.5–5.1)
Sodium: 137 mmol/L (ref 135–145)

## 2024-11-27 LAB — CBC
HCT: 29.1 % — ABNORMAL LOW (ref 36.0–46.0)
Hemoglobin: 9.3 g/dL — ABNORMAL LOW (ref 12.0–15.0)
MCH: 28.1 pg (ref 26.0–34.0)
MCHC: 32 g/dL (ref 30.0–36.0)
MCV: 87.9 fL (ref 80.0–100.0)
Platelets: 181 K/uL (ref 150–400)
RBC: 3.31 MIL/uL — ABNORMAL LOW (ref 3.87–5.11)
RDW: 14.7 % (ref 11.5–15.5)
WBC: 3.5 K/uL — ABNORMAL LOW (ref 4.0–10.5)
nRBC: 2 % — ABNORMAL HIGH (ref 0.0–0.2)

## 2024-11-27 LAB — MAGNESIUM: Magnesium: 1.6 mg/dL — ABNORMAL LOW (ref 1.7–2.4)

## 2024-11-27 LAB — PHOSPHORUS: Phosphorus: 3.3 mg/dL (ref 2.5–4.6)

## 2024-11-27 LAB — CANCER ANTIGEN 27.29: CA 27.29: 325.6 U/mL — ABNORMAL HIGH (ref 0.0–38.6)

## 2024-11-27 MED ORDER — BISACODYL 10 MG RE SUPP
10.0000 mg | Freq: Once | RECTAL | Status: AC
  Administered 2024-11-27: 10 mg via RECTAL
  Filled 2024-11-27: qty 1

## 2024-11-27 MED ORDER — MAGNESIUM SULFATE 2 GM/50ML IV SOLN
2.0000 g | Freq: Once | INTRAVENOUS | Status: AC
  Administered 2024-11-27: 2 g via INTRAVENOUS
  Filled 2024-11-27: qty 50

## 2024-11-27 NOTE — Progress Notes (Signed)
 [facility] Progress Note  Patient: Samantha Ford    FMW:[id number]  DOA: 11/24/2024     Date of Service: the patient was seen and examined on 11/27/2024  Chief Complaint  Patient presents with   Back Pain   Brief hospital course: Samantha Ford is a pleasant 83 y.o. female with medical history significant for HLD, osteoarthritis, GERD, history breast caner s/p mastectomy in 2015 who came into ED at [facility] for low back pain started Monday 3 days ago.   Patient complains that her pain is in the lower back, sharp, excruciating, 10/10 in intensity, nonradiating, improved with morphine  but did not resolve the pain completely.  She also complained of lower abdominal discomfort.  She denies any history of trauma, fever, chills, nausea, vomiting, diarrhea.  She denies any dysuria, hematuria, hematemesis, melena.  She denies any chest pain, shortness of breath, palpitations.  However, she endorsed that she had a fall a month ago at her son's house from the commode and hurt her head but did not hit her back.   ED Course: Upon arrival to the ED, patient is found to be in severe low back pain, CT of the abdomen was done which showed no intra-abdominal pathology, unfortunately CT scan showed finding concerning for multiple diffuse spinal compression fractures that appear pathological in nature between T7 and L4.  MRI of the thoracolumbar spine was done which showed T5-T12 and L1-L4 compression fractures likely pathological.  Neurosurgical service was consulted.  Patient was started on IV pain medications with morphine , IV Zofran , IV fluid with "Ringer's"  lactate.  Hospitalist service was consulted for evaluation for admission.     Assessment and Plan:  # Thoracolumbar pathological compression fracture Unknown cause, could be due to history of breast cancer versus multiple myeloma MRI reviewed, suggested lymphoproliferative disorder, less likely multiple myeloma Continue Tylenol , Norco and Dilaudid  IV prn   Hold off steroids for now, pending biopsy on Monday May start gabapentin or Lyrica if neuropathic pain Seen by neurosurgery, recommended TLSO brace and pain management. Consulted oncologist, recommended bone marrow biopsy Consulted IR, bone marrow biopsy will be done on Monday. Keep n.p.o. after midnight    # HLD/GERD - Resume home medications   # Hypokalemia: Repleted and resolved # Hyponatremia, mild.  Resolved # Hypomagnesemia, mag repleted. Monitor electrolytes daily and replete as needed.  # Constipation, continue laxatives 12/7 Dulcolax suppository x 1 dose given  Body mass index is 22.31 kg/m.  Interventions:   Diet: Regular diet DVT Prophylaxis: Subcutaneous Lovenox    Advance goals of care discussion: Full code  Family Communication: family was present at bedside, at the time of interview.  The pt provided permission to discuss medical plan with the family. Opportunity was given to ask question and all questions were answered satisfactorily.   Disposition:  Pt is from Home, admitted with thoracolumbar compression, malignancy workup in progress, scheduled for bone marrow biopsy on Monday, 12/8 by IR, which precludes a safe discharge. Discharge to home with home health versus SNF, when stable, most likely after biopsy done by IR which is scheduled on Monday, 12/8.  Subjective: No significant events overnight.  Patient was resting comfortably in the bed, no any pain but it hurts while movement. Patient is passing gas, no BM, patient did receive Dulcolax suppository today. Patient is aware that biopsy will be done tomorrow and if remains stable then she would like to go home after the procedure.    Physical Exam: General: NAD, lying comfortably Appear in no distress,  affect appropriate Eyes: PERRLA ENT: Oral Mucosa Clear, moist  Neck: no JVD,  Cardiovascular: S1 and S2 Present, no Murmur,  Respiratory: good respiratory effort, Bilateral Air entry equal and  Decreased, no Crackles, no wheezes Abdomen: Bowel Sound present, Soft and no tenderness,  Skin: no rashes Extremities: no Pedal edema, no calf tenderness Neurologic: without any new focal findings Gait not checked due to patient safety concerns  Vitals:   11/26/24 1618 11/26/24 2031 11/27/24 0416 11/27/24 0930  BP: 121/67 135/66 130/71 111/69  Pulse: 79 78 83 81  Resp: 17 16 18 17   Temp: 99.6 F (37.6 C) (!) 97.5 F (36.4 C) 97.9 F (36.6 C) 98.3 F (36.8 C)  TempSrc:      SpO2: 94% 94% 96% 93%  Weight:      Height:        Intake/Output Summary (Last 24 hours) at 11/27/2024 1541 Last data filed at 11/27/2024 1015 Gross per 24 hour  Intake 240 ml  Output --  Net 240 ml    Filed Weights   11/24/24 0155  Weight: 62.7 kg    Data Reviewed: I have personally reviewed and interpreted daily labs, tele strips, imagings as discussed above. I reviewed all nursing notes, pharmacy notes, vitals, pertinent old records I have discussed plan of care as described above with RN and patient/family.  CBC: Recent Labs  Lab 11/24/24 0247 11/25/24 0536 11/26/24 0503 11/27/24 0452  WBC 4.6 4.5 6.6 3.5*  HGB 10.8* 10.1* 10.7* 9.3*  HCT 32.8* 31.4* 35.1* 29.1*  MCV 87.0 88.5 90.9 87.9  PLT 195 220 256 181   Basic Metabolic Panel: Recent Labs  Lab 11/24/24 0247 11/25/24 0536 11/26/24 0503 11/27/24 0452  NA 133* 136 137 137  K 3.4* 4.4 4.2 4.1  CL 96* 97* 98 97*  CO2 23 28 28 29   GLUCOSE 98 120* 167* 113*  BUN 11 13 19 12   CREATININE 0.69 0.71 1.08* 0.71  CALCIUM 9.5 9.3 9.5 9.4  MG  --   --  1.8 1.6*  PHOS  --   --  5.0* 3.3    Studies: No results found.  Scheduled Meds:  enoxaparin  (LOVENOX ) injection  40 mg Subcutaneous Q24H   pantoprazole   40 mg Oral Daily   polyethylene glycol  17 g Oral BID   senna  1 tablet Oral Daily   traZODone   50 mg Oral QHS   Continuous Infusions: PRN Meds: acetaminophen  **OR** acetaminophen , bisacodyl , HYDROcodone -acetaminophen ,  HYDROmorphone  (DILAUDID ) injection, ondansetron  **OR** ondansetron  (ZOFRAN ) IV  Time spent: 40 minutes  Author: ELVAN SOR. MD [facility] 11/27/2024 3:41 PM  To reach On-call, see care teams to locate the attending and reach out to them via [email or web address]. If 7PM-7AM, please contact night-coverage If you still have difficulty reaching the attending provider, please page the [facility] (Director on Call) for [facility] on amion for assistance.

## 2024-11-27 NOTE — Progress Notes (Signed)
 Mobility Specialist Progress Note:    11/27/24 1127  Mobility  Activity Ambulated with assistance  Level of Assistance Standby assist, set-up cues, supervision of patient - no hands on  Assistive Device None  Distance Ambulated (ft) 24 ft  Range of Motion/Exercises Active;All extremities  Activity Response Tolerated well  Mobility visit 1 Mobility  Mobility Specialist Start Time (ACUTE ONLY) 1120  Mobility Specialist Stop Time (ACUTE ONLY) 1127  Mobility Specialist Time Calculation (min) (ACUTE ONLY) 7 min   Assisted pt to bathroom, required SBA to stand and ambulate with no AD. Tolerated well, asx throughout. Family at bedside, all needs met.  Sherrilee Ditty Mobility Specialist Please contact via Special Educational Needs Teacher or  Rehab office at [phone]

## 2024-11-27 NOTE — Plan of Care (Signed)

## 2024-11-27 NOTE — Plan of Care (Signed)

## 2024-11-27 NOTE — Consult Note (Signed)
 Chief Complaint:  Bony lesions concerning for multiple myeloma  Procedure: Bone marrow biopsy with aspiration  Referring Provider(s): Dr. ONEIDA Reusing  Supervising Physician: Vanice Revel  Patient Status: [facility] - In-pt  History of Present Illness: Samantha Ford is a 83 y.o. female with a history of osteoarthritis and lobular right sided breast cancer s/p mastectomy in 2015 who presented to the ED on 12/4 with progressively worsening back pain and abdominal pain for the past few days. Also reports associated episodes of vomiting and inability to keep food down. She reports chronic back pain for the past several months that became severely worse in the past week. Rates her pain as a 10/10 most of the time with it being worse than this frequently. ED workup significant for imaging concerning for diffuse spinal compression fractures with constellation of highly suspicious findings for a pathologic marrow infiltrative process or metastatic disease. Follow up MRI also with diffuse abnormal marrow signal throughout the spine. Consider metastatic disease vs lymphoproliferative disorder vs less likely multiple myeloma.  Case and imaging discussed between Dr. Reusing and Dr. DELENA Balder who recommends bone marrow biopsy.   Patient is currently sitting upright in bedside chair with family at the bedside. States that she continues to have severe back pain that she rates a 10/10. She reports improved nausea and vomiting since admission, but continues to have minimal appetite. Also reports approximately 30lb weight loss in the last 6 months. She denies any shortness of breath or chest pain. Discussed with patient lying on her stomach for the procedure; she is uncertain if she will be able to tolerate this position, but is willing to try. All questions and concerns answered at the bedside.    Patient is Full Code  Past Medical History:  Diagnosis Date   Breast cancer (HCC) 10/22/2013   Mastectomy in 2015    Cancer [facility])    right breast 12/06/2013   GERD (gastroesophageal reflux disease)    Hyperlipidemia    Insomnia    Macular degeneration of right eye    Osteoarthritis    Pneumonia    Pneumothorax     Past Surgical History:  Procedure Laterality Date   ANKLE FRACTURE SURGERY Right 09/30/2010   BREAST BIOPSY Left 11/29/2015   neg, ribbon clip   BREAST BIOPSY Left 11/29/2015   neg, coil clip    KNEE ARTHROSCOPY Right 04/04/2008   MASTECTOMY Right 12/29/2013   PARTIAL MASTECTOMY WITH AXILLARY SENTINEL LYMPH NODE BIOPSY Right 12/06/2013   REVERSE SHOULDER ARTHROPLASTY Left 08/06/2023   Procedure: REVERSE SHOULDER ARTHROPLASTY WITH BICEPS TENODESIS.;  Surgeon: Edie Norleen PARAS, MD;  Location: [facility] ORS;  Service: Orthopedics;  Laterality: Left;    Allergies: Patient has no known allergies.  Medications: Prior to Admission medications   Medication Sig Start Date End Date Taking? Authorizing Provider  acetaminophen  (TYLENOL ) 500 MG tablet Take 1,000 mg by mouth at bedtime as needed.   Yes [provider]  cetirizine (ZYRTEC) 10 MG tablet Take 10 mg by mouth daily.   Yes [provider]  Cholecalciferol (VITAMIN D3) 50 MCG CAPS Take 1 capsule by mouth at bedtime.   Yes [provider]  diphenhydrAMINE (BENADRYL) 25 MG tablet Take 25 mg by mouth at bedtime.   Yes [provider]  ibandronate (BONIVA) 150 MG tablet Take 150 mg by mouth every 30 (thirty) days.   Yes [provider]  ibuprofen (ADVIL) 200 MG tablet Take 400 mg by mouth.   Yes [provider]  lidocaine  (LIDODERM ) 5 % Place 1 patch onto the skin daily. Remove & Discard patch within 12 hours or as directed by MD 05/13/24  Yes Kommor, [place], MD  Multiple Vitamins-Minerals (MULTIVITAMIN ADULTS 50+ PO) Take 1 tablet by mouth at bedtime.   Yes [provider]  Multiple Vitamins-Minerals (PRESERVISION AREDS 2) CAPS Take 1 capsule by mouth at bedtime.   Yes [provider]  Omega-3 Fatty Acids (OMEGA-3 FISH OIL) 1200 MG CAPS Take 1 capsule by mouth at bedtime.   Yes [provider]  omeprazole (PRILOSEC) 40 MG capsule Take 40 mg by mouth daily. 09/29/24 09/29/25 Yes [provider]  tiZANidine (ZANAFLEX) 2 MG tablet Take 2 mg by mouth every 8 (eight) hours as needed. 08/30/24 08/30/25 Yes [provider]  traMADol (ULTRAM) 50 MG tablet Take 50 mg by mouth 2 (two) times daily as needed for moderate pain (pain score 4-6). 11/01/24  Yes [provider]  traZODone  (DESYREL ) 50 MG tablet Take 50 mg by mouth at bedtime.   Yes [provider]  HYDROcodone -acetaminophen  (NORCO/VICODIN) 5-325 MG tablet Take 1 tablet by mouth every 6 (six) hours as needed for moderate pain (pain score 4-6). Patient not taking: Reported on 11/24/2024 06/02/24   Hilma Hastings, PA-C  Magnesium  250 MG TABS Take 1 tablet by mouth at bedtime.    [provider]  methocarbamol (ROBAXIN) 500 MG tablet Take 500 mg by mouth 2 (two) times daily as needed. Patient not taking: Reported on 11/24/2024 11/01/24   [provider]  Methylcobalamin 500 MCG CHEW Chew 2 capsules by mouth at bedtime. Patient not taking: Reported on 11/24/2024    [provider]  omeprazole (PRILOSEC) 20 MG capsule Take 20 mg by mouth daily. Patient not taking: Reported on 11/24/2024    [provider]  ondansetron  (ZOFRAN -ODT) 4 MG disintegrating tablet Take 1 tablet (4 mg total) by mouth every 8 (eight) hours as needed for nausea or vomiting. Patient not taking: Reported on 11/24/2024 05/19/24   Viviann Pastor, MD  pravastatin (PRAVACHOL) 20 MG tablet Take 20 mg by mouth at bedtime.    [provider]     Family History  Problem Relation Age of Onset   Breast cancer Neg Hx     Social History   Socioeconomic History   Marital status: Widowed    Spouse name: Not on file   Number of children: 2   Years of education: Not on file    Highest education level: Not on file  Occupational History   Not on file  Tobacco Use   Smoking status: Never   Smokeless tobacco: Never  Vaping Use   Vaping status: Never Used  Substance and Sexual Activity   Alcohol use: Not Currently   Drug use: Never   Sexual activity: Not on file  Other Topics Concern   Not on file  Social History Narrative   Grandson lives with her   Social Drivers of Health   Financial Resource Strain: Low Risk  (04/19/2024)   Received from [facility]   Overall Financial Resource Strain (CARDIA)    Difficulty of Paying Living Expenses: Not hard at all  Food Insecurity: No Food Insecurity (11/24/2024)   Hunger Vital Sign    Worried About Running Out of Food in the Last Year: Never true    Ran Out of Food in the Last Year: Never true  Transportation Needs: No Transportation Needs (11/24/2024)   PRAPARE - Transportation  Lack of Transportation (Medical): No    Lack of Transportation (Non-Medical): No  Physical Activity: Not on file  Stress: Not on file  Social Connections: Moderately Integrated (11/24/2024)   Social Connection and Isolation Panel    Frequency of Communication with Friends and Family: Three times a week    Frequency of Social Gatherings with Friends and Family: Three times a week    Attends Religious Services: 1 to 4 times per year    Active Member of Clubs or Organizations: Yes    Attends Banker Meetings: 1 to 4 times per year    Marital Status: Widowed     Review of Systems  Constitutional:  Positive for appetite change, fever and unexpected weight change.  Gastrointestinal:  Positive for abdominal pain.  Musculoskeletal:  Positive for back pain.  Neurological:  Positive for dizziness (mostly with standing).  Patient denies any headache, chest pain, shortness of breath, abdominal pain, N/V, or fever/chills. All other systems are negative.   Vital Signs: BP 111/69 (BP Location: Left Arm)    Pulse 81   Temp 98.3 F (36.8 C)   Resp 17   Ht 5' 6 (1.676 m)   Wt 138 lb 3.2 oz (62.7 kg)   SpO2 93%   BMI 22.31 kg/m    Physical Exam Vitals reviewed.  Constitutional:      Appearance: Normal appearance.  HENT:     Mouth/Throat:     Mouth: Mucous membranes are moist.     Pharynx: Oropharynx is clear.  Cardiovascular:     Rate and Rhythm: Normal rate.  Pulmonary:     Effort: Pulmonary effort is normal.  Abdominal:     General: Abdomen is flat.     Palpations: Abdomen is soft.     Tenderness: There is no abdominal tenderness.  Musculoskeletal:     Comments: Extremely limited due to pain; currently in TLSO brace  Skin:    General: Skin is warm and dry.  Neurological:     Mental Status: She is alert and oriented to person, place, and time.  Psychiatric:        Behavior: Behavior normal.     Imaging: CT Chest W Contrast Result Date: 11/24/2024 EXAM: CT CHEST WITH CONTRAST 11/24/2024 07:40:35 AM TECHNIQUE: CT of the chest was performed with the administration of 75 mL of iohexol  (OMNIPAQUE ) 300 MG/ML solution. Multiplanar reformatted images are provided for review. Automated exposure control, iterative reconstruction, and/or weight based adjustment of the mA/kV was utilized to reduce the radiation dose to as low as reasonably achievable. COMPARISON: CT abdomen and pelvis, thoracic and lumbar spine MRI reported separately today. CLINICAL HISTORY: 83 year old female with malignancy, unknown primary, and pathologic spine fractures. FINDINGS: MEDIASTINUM: Calcified coronary artery and aortic atherosclerosis. Heart size within normal limits. No pericardial effusion. The central airways are clear. Central pulmonary arteries appear to be normally enhancing. Moderate sized gastric hiatal hernia. Elevated right hemidiaphragm. LYMPH NODES: No mediastinal, hilar or axillary lymphadenopathy. LUNGS AND PLEURA: Evidence of centrilobular emphysema, mild in the upper lobes. Bilateral lung base  mild bronchiectasis, chronic atelectasis and scarring in part related to chronic hiatal hernia and chronic elevation of the right hemidiaphragm. No active lung inflammation. No pleural effusion or pneumothorax. SOFT TISSUES/BONES: Diffusely abnormal bone mineralization throughout the thoracic spine with numerous thoracic compression fractures, detailed on MRI separately today. There is a mild degree of superimposed thoracic spinal hyperostosis with occasional interbody ankylosis (T3-T4, T9-T10). Heterogeneous bilateral ribs. No acute or pathologic  rib fracture is identified. Streak artifact from left shoulder arthroplasty. UPPER ABDOMEN: Visible upper abdominal viscera are stable from CT abdomen and pelvis reported separately today. IMPRESSION: 1. Abnormal thoracic spine as detailed on MRI separately today. Heterogeneous ribs, but no definite acute or pathologic rib fracture identified. 2. No superimposed acute or malignant process identified in the lungs or mediastinum. 3. Lung scarring and atelectasis with a mild degree of underlying Emphysema. Aortic and coronary artery atherosclerosis. Moderate gastric hiatal hernia and chronic elevated right hemidiaphragm. Electronically signed by: Helayne Hurst MD 11/24/2024 09:09 AM EST RP Workstation: [id number]   MR Lumbar Spine W Wo Contrast Result Date: 11/24/2024 EXAM: MRI LUMBAR SPINE 11/24/2024 07:11:00 AM TECHNIQUE: Multiplanar multisequence MRI of the lumbar spine was performed without and with the administration of intravenous contrast. 6 mL (gadobutrol  (GADAVIST ) 1 MMOL/ML injection 6 mL GADOBUTROL  1 MMOL/ML IV SOLN). COMPARISON: Thoracic MRI 11/24/2024 reported separately. CT abdomen and pelvis 11/24/2024 reported separately. CLINICAL HISTORY: 83 year old female with new widespread spinal compression fractures and diffuse heterogeneous skeletal mineralization on CT this morning. FINDINGS: Normal lumbar segmentation, concordant with thoracic numbering today. BONES  AND ALIGNMENT: Diffusely heterogeneous T1 marrow signal throughout the visible spine. Heterogeneous marrow in the sacrum and pelvis although less affected (series 13 image 39). Lower thoracic spinal compression fractures redemonstrated. Lumbar spinal compression fractures range from the L1 level (74% loss of vertebral body height) to the L2 level (4% loss of vertebral body height). All levels including the central sacrum demonstrate diffuse increased STIR hyperintensity and heterogeneous hyperenhancement following contrast (series 14 image 6). Sacrum appears grossly intact. Retropulsion of the L1 posterior inferior endplate results in mild spinal stenosis seen on series 12 image 12. No extraosseous extension of tumor or soft tissue is identified in the lumbar spine. SPINAL CORD: Normal conus medullaris at L1. No signal abnormality in the visible lower thoracic spinal cord or conus. No abnormal intradural enhancement. No dural thickening. Normal cauda equina nerve roots. SOFT TISSUES: No paraspinal mass. Stable visible abdominal viscera. DEGENERATIVE: Pathologic compression fracture related mild spinal stenosis at the L1 level. Otherwise ordinary lumbar spine degeneration including disc bulging and endplate spurring. No high grade lumbar spine stenosis. There is moderate lumbar neural foraminal stenosis at the right L1 and L4 nerve levels. IMPRESSION: 1. Diffuse abnormal marrow signal throughout the spine, as seen on Thoracic MRI. Sacrum and pelvis are less affected. Consider metastatic disease, lymphoproliferative disorder, or less likely multiple myeloma (given heterogeneous sclerosis by CT). 2. Lumbar compression fractures with loss of height ranging from 74% (L1 ) to 4% (L2). Mild spinal and moderate right neural foraminal stenosis at L1 due to retropulsion. No extraosseous tumor or soft tissue. 3. Negative conus medullaris and cauda equina. Electronically signed by: Helayne Hurst MD 11/24/2024 07:33 AM EST RP  Workstation: [id number]   MR THORACIC SPINE W WO CONTRAST Result Date: 11/24/2024 EXAM: MRI THORACIC SPINE WITH AND WITHOUT INTRAVENOUS CONTRAST 11/24/2024 07:11:00 AM TECHNIQUE: Multiplanar multisequence MRI of the thoracic spine was performed with and without the administration of intravenous contrast. COMPARISON: CT abdomen and pelvis 11/24/2024. CLINICAL HISTORY: 83 year old female with diffusely abnormal skeletal bone mineralization and diffuse thoracic and lumbar compression fractures, new from May. FINDINGS: Limited sagittal imaging of the cervical spine demonstrates diffuse abnormality T1 hypointense marrow replacement, only partially sparing the anterior C2 vertebra (series 7 image 3). Thoracic segmentation appears to be normal. BONES AND ALIGNMENT: Heterogeneous diffusely abnormal T1 marrow signal throughout the thoracic vertebrae. Diffuse increased STIR signal  within the thoracic vertebrae, including the levels T1 through T4 which appear to remain intact. From T5 through T12 there are thoracic compression fractures with loss of height ranging from T5 level (6% loss of vertebral body height) to the T7 level (48% loss of vertebral body height). No significant thoracic vertebral retropulsion. Also on sagittal STIR images there is a discrete round T6 vertebral body lesion located posteriorly on the right with intense increased STIR signal (series 9 image 7) but is relatively hypoenhancing following contrast. Similar signal abnormality in the T6 lamina and spinous process there (series 9 image 9). Smaller T11 round 11 mm right posterior vertebral body lesion is heterogeneously enhancing on series 14 image 7. Similar rounded lower cervical vertebral lesions are partially visible such as at C7 on the left (series 9 image 10). Visible posterior ribs are also diffusely heterogeneous. There is no extraosseous tumor or soft tissue extension identified in the thoracic spine. SPINAL CORD: Normal thoracic spinal  cord signal and morphology. No abnormal intradural enhancement. No dural thickening. SOFT TISSUES: Negative visible soft tissues at the thoracic inlet, mediastinum. Stable visible upper abdominal viscera. DEGENERATIVE CHANGES: Thoracic spinal canal is capacious. Mild for age thoracic spine disc degeneration including a small central disc protrusion at T2-T3 (series 10 image 14). No thoracic spinal stenosis. IMPRESSION: 1. Widespread thoracic pathologic compression fractures: T5 through T12 with loss of height ranging from 6% to 48%. No retropulsion, extraosseous tumor, or other complicating features. 2. Underlying Diffuse abnormal spinal and rib marrow signal compatible with an infiltrative marrow process such as: metastatic disease, lymphoproliferative disorder, or less likely multiple myeloma (given heterogeneous sclerosis by CT). Multiple small, round and discrete STIR hyperintense vertebral lesions are noted, although not hyperenhancing. 3. No thoracic spinal stenosis. Normal thoracic spinal cord. Electronically signed by: Helayne Hurst MD 11/24/2024 07:24 AM EST RP Workstation: [id number]   CT ABDOMEN PELVIS W CONTRAST Result Date: 11/24/2024 EXAM: CT ABDOMEN AND PELVIS WITH CONTRAST 11/24/2024 04:55:30 AM TECHNIQUE: CT of the abdomen and pelvis was performed with the administration of 100 mL of iohexol  (OMNIPAQUE ) 300 MG/ML solution. Multiplanar reformatted images are provided for review. Automated exposure control, iterative reconstruction, and/or weight-based adjustment of the mA/kV was utilized to reduce the radiation dose to as low as reasonably achievable. COMPARISON: CT abdomen and pelvis 05/19/2024. CLINICAL HISTORY: 83 year old female. LLQ abdominal pain. FINDINGS: LOWER CHEST: No acute abnormality. HEART AND MEDIASTINUM: Advanced left coronary artery calcified atherosclerosis or stent on series 2 image 2. Stable heart size, upper limits of normal. No pericardial effusion. PLEURAL SPACES: No pleural  effusion. LUNGS: Chronic elevation of the right hemidiaphragm. Chronic bilateral lung base atelectasis. LIVER: The liver is unremarkable. GALLBLADDER AND BILE DUCTS: Gallbladder is unremarkable. No biliary ductal dilatation. SPLEEN: No acute abnormality. PANCREAS: No acute abnormality. ADRENAL GLANDS: No acute abnormality. KIDNEYS, URETERS AND BLADDER: Renal parenchymal volume appears stable since May. Renal enhancement and contrast excretion appears symmetric and normal. No stones in the kidneys or ureters. No hydronephrosis. No perinephric or periureteral stranding. Urinary bladder is unremarkable. GI AND BOWEL: Moderate chronic gastric hiatal hernia. Intraabdominal stomach and duodenum appear negative. Widespread diverticulosis of the distal descent and sigmoid colon with no active inflammation. Less pronounced large bowel diverticula elsewhere. Fluid in the ascending colon to the hepatic flexure. No dilated or inflamed bowel loops. There is no bowel obstruction. APPENDIX: Normal gas containing appendix on series 2 image 41. PERITONEUM AND RETROPERITONEUM: No ascites. No free air. VASCULATURE: Advanced calcified aortic atherosclerosis. Major arterial  structures and portal venous system remain patent. ILIOFEMORAL ARTERIES: Less pronounced iliofemoral calcified atherosclerosis. LYMPH NODES: No lymphadenopathy. REPRODUCTIVE ORGANS: No acute abnormality. BONES AND SOFT TISSUES: Pronounced abnormal bone mineralization since May this year. At that time, L1 compression fracture was acute/subacute. There are now extensive lower thoracic and lumbar spinal compression fractures from the visible T7 through L4 levels, with associated bone mineralization heterogeneity, including heterogeneous sclerosis throughout the visible ribs and pelvis, which seem to remain intact. Retropulsion of bone at L1 results in mild spinal stenosis (sagittal image 53). No discrete destructive osseous lesion. Marked change in diffuse skeletal bone  mineralization since May, and diffuse spinal compression fractures since that time. Constellation highly suspicious for a pathologic marrow infiltrative process. No discrete destructive bone lesion, but metastatic disease, myelofibrosis, or similar etiology. No paraspinal soft tissue mass. No focal soft tissue abnormality. IMPRESSION: 1. Marked change in the skeletal bone mineralization since May, with diffuse spinal compression fractures since that time. Constellation highly suspicious for a pathologic marrow infiltrative process. Although no discrete destructive bone lesion, consider Metastatic disease, Myelofibrosis, or similar etiology. 2. Mild retropulsion of L1 resulting in mild spinal stenosis. 3. No superimposed acute or metastatic soft tissue abnormality identified in the abdomen or pelvis. Chronic aortic atherosclerosis, coronary artery atherosclerosis , moderate gastric hiatal hernia. Electronically signed by: Helayne Hurst MD 11/24/2024 05:20 AM EST RP Workstation: [id number]    Labs:  CBC: Recent Labs    11/24/24 0247 11/25/24 0536 11/26/24 0503 11/27/24 0452  WBC 4.6 4.5 6.6 3.5*  HGB 10.8* 10.1* 10.7* 9.3*  HCT 32.8* 31.4* 35.1* 29.1*  PLT 195 220 256 181    COAGS: Recent Labs    11/25/24 0536  INR 1.0    BMP: Recent Labs    11/24/24 0247 11/25/24 0536 11/26/24 0503 11/27/24 0452  NA 133* 136 137 137  K 3.4* 4.4 4.2 4.1  CL 96* 97* 98 97*  CO2 23 28 28 29   GLUCOSE 98 120* 167* 113*  BUN 11 13 19 12   CALCIUM 9.5 9.3 9.5 9.4  CREATININE 0.69 0.71 1.08* 0.71  GFRNONAA >60 >60 50* >60    LIVER FUNCTION TESTS: Recent Labs    05/19/24 0643 11/24/24 0247 11/25/24 0536  BILITOT 1.2 0.4 0.3  AST 21 31 28   ALT 14 9 8   ALKPHOS 70 112 108  PROT 7.0 7.4 7.1  ALBUMIN 3.4* 4.0 3.9    TUMOR MARKERS: No results for input(s): AFPTM, CEA, CA199, CHROMGRNA in the last 8760 hours.  Assessment and Plan:  Bony Lesions: Samantha Ford is a 83 y.o. female  with a history of lobular right sided breast cancer s/p mastectomy who presented to the ED on 12/4 with worsening back pain. Workup revealed concern for lymphoproliferative disease and imaging with diffuse pathological fractures. IR consulted for possible bone marrow biopsy which patient is scheduled to undergo in IR on 12/8. Procedure to be performed under moderate sedation.  -NPO since MN -Will attempt bone marrow biopsy in IR today if patient can tolerate positioning  Risks and benefits of bone marrow biopsy was discussed with the patient and/or patient's family including, but not limited to bleeding, infection, damage to adjacent structures or low yield requiring additional tests.  All of the questions were answered and there is agreement to proceed.  Consent signed and in chart.   Thank you for allowing our service to participate in Samantha Ford 's care.    Electronically Signed: Advaith Lamarque M Kayra Crowell, PA-C   11/27/2024, 1:03  PM     I spent a total of 40 Minutes in face to face in clinical consultation, greater than 50% of which was counseling/coordinating care for bone marrow biopsy with aspiration.

## 2024-11-28 ENCOUNTER — Observation Stay

## 2024-11-28 ENCOUNTER — Observation Stay: Admitting: Radiology

## 2024-11-28 ENCOUNTER — Other Ambulatory Visit: Payer: Self-pay

## 2024-11-28 DIAGNOSIS — R52 Pain, unspecified: Secondary | ICD-10-CM | POA: Diagnosis not present

## 2024-11-28 HISTORY — PX: IR BONE MARROW BIOPSY & ASPIRATION: IMG5727

## 2024-11-28 LAB — CBC WITH DIFFERENTIAL/PLATELET
Abs Immature Granulocytes: 0.1 K/uL — ABNORMAL HIGH (ref 0.00–0.07)
Basophils Absolute: 0 K/uL (ref 0.0–0.1)
Basophils Relative: 1 %
Eosinophils Absolute: 0.1 K/uL (ref 0.0–0.5)
Eosinophils Relative: 4 %
HCT: 28.7 % — ABNORMAL LOW (ref 36.0–46.0)
Hemoglobin: 9.2 g/dL — ABNORMAL LOW (ref 12.0–15.0)
Immature Granulocytes: 3 %
Lymphocytes Relative: 35 %
Lymphs Abs: 1.1 K/uL (ref 0.7–4.0)
MCH: 28.5 pg (ref 26.0–34.0)
MCHC: 32.1 g/dL (ref 30.0–36.0)
MCV: 88.9 fL (ref 80.0–100.0)
Monocytes Absolute: 0.3 K/uL (ref 0.1–1.0)
Monocytes Relative: 9 %
Neutro Abs: 1.5 K/uL — ABNORMAL LOW (ref 1.7–7.7)
Neutrophils Relative %: 48 %
Platelets: 185 K/uL (ref 150–400)
RBC: 3.23 MIL/uL — ABNORMAL LOW (ref 3.87–5.11)
RDW: 14.7 % (ref 11.5–15.5)
WBC: 3.1 K/uL — ABNORMAL LOW (ref 4.0–10.5)
nRBC: 1.9 % — ABNORMAL HIGH (ref 0.0–0.2)

## 2024-11-28 LAB — PROTEIN ELECTROPHORESIS, SERUM
A/G Ratio: 0.9 (ref 0.7–1.7)
Albumin ELP: 3.2 g/dL (ref 2.9–4.4)
Alpha-1-Globulin: 0.3 g/dL (ref 0.0–0.4)
Alpha-2-Globulin: 1.2 g/dL — ABNORMAL HIGH (ref 0.4–1.0)
Beta Globulin: 1 g/dL (ref 0.7–1.3)
Gamma Globulin: 1 g/dL (ref 0.4–1.8)
Globulin, Total: 3.6 g/dL (ref 2.2–3.9)
Total Protein ELP: 6.8 g/dL (ref 6.0–8.5)

## 2024-11-28 LAB — PHOSPHORUS: Phosphorus: 3.6 mg/dL (ref 2.5–4.6)

## 2024-11-28 LAB — BASIC METABOLIC PANEL WITH GFR
Anion gap: 9 (ref 5–15)
BUN: 13 mg/dL (ref 8–23)
CO2: 30 mmol/L (ref 22–32)
Calcium: 9.4 mg/dL (ref 8.9–10.3)
Chloride: 98 mmol/L (ref 98–111)
Creatinine, Ser: 0.73 mg/dL (ref 0.44–1.00)
GFR, Estimated: >60 mL/min (ref >60)
Glucose, Bld: 91 mg/dL (ref 70–99)
Potassium: 4.6 mmol/L (ref 3.5–5.1)
Sodium: 137 mmol/L (ref 135–145)

## 2024-11-28 LAB — KAPPA/LAMBDA LIGHT CHAINS
Kappa free light chain: 32.5 mg/L — ABNORMAL HIGH (ref 3.3–19.4)
Kappa, lambda light chain ratio: 1.23 (ref 0.26–1.65)
Lambda free light chains: 26.4 mg/L — ABNORMAL HIGH (ref 5.7–26.3)

## 2024-11-28 LAB — MAGNESIUM: Magnesium: 1.9 mg/dL (ref 1.7–2.4)

## 2024-11-28 MED ORDER — LIDOCAINE 1 % OPTIME INJ - NO CHARGE
6.0000 mL | Freq: Once | INTRAMUSCULAR | Status: AC
  Administered 2024-11-28: 6 mL via INTRADERMAL
  Filled 2024-11-28: qty 6

## 2024-11-28 MED ORDER — MIDAZOLAM HCL 5 MG/5ML IJ SOLN
INTRAMUSCULAR | Status: AC | PRN
  Administered 2024-11-28: .5 mg via INTRAVENOUS

## 2024-11-28 MED ORDER — HYDROCODONE-ACETAMINOPHEN 5-325 MG PO TABS
1.0000 | ORAL_TABLET | Freq: Four times a day (QID) | ORAL | 0 refills | Status: DC | PRN
  Filled 2024-11-28: qty 20, 5d supply, fill #0

## 2024-11-28 MED ORDER — ACETAMINOPHEN 325 MG PO TABS: 650.0000 mg | ORAL_TABLET | Freq: Three times a day (TID) | ORAL | Status: AC | PRN

## 2024-11-28 MED ORDER — FENTANYL CITRATE (PF) 100 MCG/2ML IJ SOLN
INTRAMUSCULAR | Status: AC | PRN
  Administered 2024-11-28: 25 ug via INTRAVENOUS

## 2024-11-28 MED ORDER — MIDAZOLAM HCL 2 MG/2ML IJ SOLN
INTRAMUSCULAR | Status: AC
  Filled 2024-11-28: qty 2

## 2024-11-28 MED ORDER — HEPARIN SOD (PORK) LOCK FLUSH 100 UNIT/ML IV SOLN
INTRAVENOUS | Status: AC
  Filled 2024-11-28: qty 5

## 2024-11-28 MED ORDER — FENTANYL CITRATE (PF) 100 MCG/2ML IJ SOLN
INTRAMUSCULAR | Status: AC
  Filled 2024-11-28: qty 2

## 2024-11-28 NOTE — Evaluation (Signed)
 Physical Therapy Evaluation Patient Details Name: Samantha Ford MRN: [id number] DOB: 1941/12/11 Today's Date: 11/28/2024  History of Present Illness  Samantha Ford is a pleasant 83 y.o. female with medical history significant for HLD, osteoarthritis, GERD, history breast caner s/p mastectomy in 2015 who came into ED at [facility] for low back pain started Monday 3 days ago.   Patient complains that her pain is in the lower back, sharp, excruciating, 10/10 in intensity, nonradiating, improved with morphine  but did not resolve the pain completely.  She also complained of lower abdominal discomfort.  She denies any history of trauma, fever, chills, nausea, vomiting, diarrhea.  She denies any dysuria, hematuria, hematemesis, melena.  She denies any chest pain, shortness of breath, palpitations.  However, she endorsed that she had a fall a month ago at her son's house from the commode and hurt her head but did not hit her back.   Clinical Impression  Pt admitted with above diagnosis. Pt currently with functional limitations due to the deficits listed below (see PT Problem List). Pt received supine in bed agreeable to PT. Grand daughter present. Pt with close and regular family support. At baseline pt is indep with gait and ADL's. Family assists with IADL's.   To date, pt with TLSO donned but is elevated on pt out of place. Pt with fair understanding of log roll needing TC's to assist in compliance. TLSO adjusted in sitting. Review with pt on appropriate donning. Pt able to stand and ambulate at supervision level using RW completing stairs training at baseline level. Pt completing 100' with RW and 64' without AD at [facility]. Decreased step lengths to step to pattern, mildly unsteady but no LOB appreciated. Pt returns to supine in bed at supervision with education on use of DME to reduce falls risk as pt will have time at home where she is alone. Pt understanding. Also discussed sleeping in recliner in interim upon return  home if there are concerns with entering/exiting bed at home. Pt left with all needs in reach with TOC in room. Pt will benefit from skilled PT services to maximize return to PLOF and reduce falls risk.      If plan is discharge home, recommend the following: A little help with walking and/or transfers;A little help with bathing/dressing/bathroom;Assistance with cooking/housework;Assist for transportation;Help with stairs or ramp for entrance   Can travel by private vehicle        Equipment Recommendations Rolling walker (2 wheels);BSC/3in1  Recommendations for Other Services       Functional Status Assessment Patient has had a recent decline in their functional status and demonstrates the ability to make significant improvements in function in a reasonable and predictable amount of time.     Precautions / Restrictions Precautions Precautions: Fall Required Braces or Orthoses: Spinal Brace Spinal Brace: Thoracolumbosacral orthotic Restrictions Weight Bearing Restrictions Per Provider Order: No Other Position/Activity Restrictions: TLSO on at all times OOB. Don/doff sitting.      Mobility  Bed Mobility Overal bed mobility: Needs Assistance Bed Mobility: Supine to Sit, Sit to Supine     Supine to sit: Contact guard, HOB elevated, Used rails Sit to supine: Supervision   General bed mobility comments: fair performance of log roll Patient Response: Cooperative  Transfers Overall transfer level: Needs assistance Equipment used: None Transfers: Sit to/from Stand Sit to Stand: Supervision                Ambulation/Gait Ambulation/Gait assistance: Supervision Gait Distance (Feet): 150 Feet (50' no  AD, 100' with RW) Assistive device: Rolling walker (2 wheels), None Gait Pattern/deviations: Step-through pattern, Step-to pattern       General Gait Details: step through pattern with RW. Step to with no AD with mild unsteadiness.  Stairs Stairs: Yes Stairs  assistance: Supervision Stair Management: One rail Right, One rail Left, Two rails, Step to pattern, Forwards Number of Stairs: 4 General stair comments: completes safely at baseline level per pt reports.  Wheelchair Mobility     Tilt Bed Tilt Bed Patient Response: Cooperative  Modified Rankin (Stroke Patients Only)       Balance Overall balance assessment: Mild deficits observed, not formally tested                                           Pertinent Vitals/Pain Pain Assessment Pain Assessment: No/denies pain    Home Living Family/patient expects to be discharged to:: Private residence Living Arrangements: Other relatives (grandson) Available Help at Discharge: Family;Available PRN/intermittently Type of Home: House Home Access: Stairs to enter Entrance Stairs-Rails: Right;Left;Can reach both Entrance Stairs-Number of Steps: 4   Home Layout: Multi-level;Able to live on main level with bedroom/bathroom        Prior Function Prior Level of Function : Independent/Modified Independent                     Extremity/Trunk Assessment   Upper Extremity Assessment Upper Extremity Assessment: Overall WFL for tasks assessed    Lower Extremity Assessment Lower Extremity Assessment: Overall WFL for tasks assessed       Communication   Communication Communication: No apparent difficulties    Cognition Arousal: Alert Behavior During Therapy: WFL for tasks assessed/performed   PT - Cognitive impairments: No apparent impairments                         Following commands: Intact       Cueing Cueing Techniques: Verbal cues     General Comments General comments (skin integrity, edema, etc.): TLSO adjusted prior to mobility.    Exercises Other Exercises Other Exercises: discussed log roll, DME needs/benefits, don/doff TLSO stair training   Assessment/Plan    PT Assessment Patient needs continued PT services  PT Problem  List Decreased strength;Pain;Decreased activity tolerance;Decreased balance;Decreased mobility       PT Treatment Interventions DME instruction;Balance training;Gait training;Neuromuscular re-education;Stair training;Functional mobility training;Patient/family education;Therapeutic activities;Therapeutic exercise    PT Goals (Current goals can be found in the Care Plan section)  Acute Rehab PT Goals Patient Stated Goal: to go home PT Goal Formulation: With patient Time For Goal Achievement: 12/12/24 Potential to Achieve Goals: Good    Frequency Min 2X/week     Co-evaluation               AM-PAC PT 6 Clicks Mobility  Outcome Measure Help needed turning from your back to your side while in a flat bed without using bedrails?: A Little Help needed moving from lying on your back to sitting on the side of a flat bed without using bedrails?: A Little Help needed moving to and from a bed to a chair (including a wheelchair)?: A Little Help needed standing up from a chair using your arms (e.g., wheelchair or bedside chair)?: A Little Help needed to walk in hospital room?: A Little Help needed climbing 3-5 steps with a railing? :  A Little 6 Click Score: 18    End of Session Equipment Utilized During Treatment: Back brace Activity Tolerance: Patient tolerated treatment well Patient left: in bed;with call bell/phone within reach;with bed alarm set;with family/visitor present Nurse Communication: Mobility status PT Visit Diagnosis: Other abnormalities of gait and mobility (R26.89);Muscle weakness (generalized) (M62.81);History of falling (Z91.81);Pain Pain - part of body:  (spine)    Time: 8697-8674 PT Time Calculation (min) (ACUTE ONLY): 23 min   Charges:   PT Evaluation $PT Eval Low Complexity: 1 Low PT Treatments $Self Care/Home Management: 8-22 PT General Charges $$ ACUTE PT VISIT: 1 Visit         Dorina HERO. Fairly IV, PT, DPT Physical Therapist- [place]   [facility] 11/28/2024, 1:39 PM

## 2024-11-28 NOTE — Discharge Summary (Signed)
 [facility] Discharge Summary   Patient: Samantha Ford FMW:[id number]  PCP: Diedra Lame, MD  Date of admission: 11/24/2024   Date of discharge:  11/28/2024     Discharge Diagnoses:  Principal Problem:   Intractable pain Active Problems:   History of breast cancer   HLD (hyperlipidemia)   Lumbar compression fracture (HCC)   Thoracic compression fracture (HCC)   Pathological fracture   Compression fracture of lumbar spine, non-traumatic, initial encounter (HCC)   Compression fracture of body of thoracic vertebra (HCC)   Palliative care encounter   Admitted From: Home Disposition:  Home with HH setup  Recommendations for Outpatient Follow-up:  PCP: F/u with PCP in 1 wk F/u with Oncologist in 1 wk Follow up LABS/TEST:  Repeat CBC with diff in 1 wk   Follow-up Information     Diedra Lame, MD Follow up in 1 week(s).   Specialty: Family Medicine Contact information: [address] [facility] [place] [address] [phone]         Jacobo Evalene PARAS, MD Follow up in 1 week(s).   Specialty: Oncology Contact information: [address] [place] [address] [phone]                Diet recommendation: Regular diet  Activity: The patient is advised to gradually reintroduce usual activities, as tolerated  Discharge Condition: stable  Code Status: Full code   History of present illness: As per the H and P dictated on admission.  Hospital Course:  Samantha Ford is a pleasant 83 y.o. female with medical history significant for HLD, osteoarthritis, GERD, history breast caner s/p mastectomy in 2015 who came into ED at [facility] for low back pain started Monday 3 days ago.   Patient complains that her pain is in the lower back, sharp, excruciating, 10/10 in intensity, nonradiating, improved with morphine  but did not resolve the pain completely.  She also complained of lower abdominal discomfort.  She  denies any history of trauma, fever, chills, nausea, vomiting, diarrhea.  She denies any dysuria, hematuria, hematemesis, melena.  She denies any chest pain, shortness of breath, palpitations.  However, she endorsed that she had a fall a month ago at her son's house from the commode and hurt her head but did not hit her back.   ED Course: Upon arrival to the ED, patient is found to be in severe low back pain, CT of the abdomen was done which showed no intra-abdominal pathology, unfortunately CT scan showed finding concerning for multiple diffuse spinal compression fractures that appear pathological in nature between T7 and L4.  MRI of the thoracolumbar spine was done which showed T5-T12 and L1-L4 compression fractures likely pathological.  Neurosurgical service was consulted.  Patient was started on IV pain medications with morphine , IV Zofran , IV fluid with "Ringer's"  lactate.  Hospitalist service was consulted for evaluation for admission.       Assessment and Plan:   # Thoracolumbar pathological compression fracture Unknown cause, could be due to history of breast cancer versus multiple myeloma MRI reviewed, suggested lymphoproliferative disorder, less likely multiple myeloma. S/p Tylenol , Norco and Dilaudid  IV prn. Did not give any steroids as it could alter the biopsy report Seen by neurosurgery, recommended TLSO brace and pain management. Consulted oncologist, recommended bone marrow biopsy Consulted IR, bone marrow biopsy was done today, patient tolerated well.  No complication.   Patient was cleared by oncology to discharge and follow-up as an outpatient.  Repeat  CBC with differential in 1 week.   # HLD/GERD: Resume home medications # Hypokalemia: Repleted and resolved # Hyponatremia, mild.  Resolved # Hypomagnesemia, mag repleted.  Resolved # Constipation, continue laxatives 12/7 Dulcolax suppository x 1 dose given   Body mass index is 22.31 kg/m.  Nutrition Interventions:  Pain  control  - [place]  Controlled Substance Reporting System database could not be reviewed as website was not working -Norco 5/325 mg p.o. Q6 hourly as needed given for pain control - Patient was instructed, not to drive, operate heavy machinery, perform activities at heights, swimming or participation in water activities or provide baby sitting services while on Pain, Sleep and Anxiety Medications; until her outpatient Physician has advised to do so again.  - Also recommended to not to take more than prescribed Pain, Sleep and Anxiety Medications.  Patient was seen by physical therapy, who recommended Home health, which was arranged. On the day of the discharge the patient's vitals were stable, and no other acute medical condition were reported by patient. the patient was felt safe to be discharge at Home with Home health.  Consultants: Orthopedic surgery, oncology, IR Procedures: s/p bone marrow biopsy done by IR (FLUORO RT ILIAC BM ASP AND CORE)  Discharge Exam: General: Appear in no distress, Oral Mucosa Clear, moist. Cardiovascular: S1 and S2 Present, no Murmur, Respiratory: normal respiratory effort, Bilateral Air entry present and no Crackles, no wheezes Abdomen: Bowel Sound present, Soft and no tenderness. Extremities: no Pedal edema, no calf tenderness Neurology: alert and oriented to time, place, and person affect appropriate.  Filed Weights   11/24/24 0155 11/28/24 0749  Weight: 62.7 kg 62.7 kg   Vitals:   11/28/24 0930 11/28/24 0945  BP: (!) 124/51 117/63  Pulse: 75 75  Resp: (!) 22 19  Temp:    SpO2: 97% 97%    DISCHARGE MEDICATION: Allergies as of 11/28/2024   No Known Allergies      Medication List     STOP taking these medications    methocarbamol 500 MG tablet Commonly known as: ROBAXIN   Methylcobalamin 500 MCG Chew   ondansetron  4 MG disintegrating tablet Commonly known as: ZOFRAN -ODT   traMADol 50 MG tablet Commonly known as: ULTRAM        TAKE these medications    acetaminophen  325 MG tablet Commonly known as: TYLENOL  Take 2 tablets (650 mg total) by mouth every 8 (eight) hours as needed for mild pain (pain score 1-3), fever or headache. What changed:  medication strength how much to take when to take this reasons to take this   cetirizine 10 MG tablet Commonly known as: ZYRTEC Take 10 mg by mouth daily.   diphenhydrAMINE 25 MG tablet Commonly known as: BENADRYL Take 25 mg by mouth at bedtime.   HYDROcodone -acetaminophen  5-325 MG tablet Commonly known as: NORCO/VICODIN Take 1 tablet by mouth every 6 (six) hours as needed for moderate pain (pain score 4-6) or severe pain (pain score 7-10). What changed: reasons to take this   ibandronate 150 MG tablet Commonly known as: BONIVA Take 150 mg by mouth every 30 (thirty) days.   ibuprofen 200 MG tablet Commonly known as: ADVIL Take 2 tablets (400 mg total) by mouth every 8 (eight) hours as needed. What changed:  when to take this reasons to take this   lidocaine  5 % Commonly known as: Lidoderm  Place 1 patch onto the skin daily. Remove & Discard patch within 12 hours or as directed by MD  Magnesium  250 MG Tabs Take 1 tablet by mouth at bedtime.   MULTIVITAMIN ADULTS 50+ PO Take 1 tablet by mouth at bedtime.   PreserVision AREDS 2 Caps Take 1 capsule by mouth at bedtime.   Omega-3 Fish Oil 1200 MG Caps Take 1 capsule by mouth at bedtime.   omeprazole 40 MG capsule Commonly known as: PRILOSEC Take 40 mg by mouth daily. What changed: Another medication with the same name was removed. Continue taking this medication, and follow the directions you see here.   pravastatin 20 MG tablet Commonly known as: PRAVACHOL Take 20 mg by mouth at bedtime.   tiZANidine 2 MG tablet Commonly known as: ZANAFLEX Take 2 mg by mouth every 8 (eight) hours as needed.   traZODone  50 MG tablet Commonly known as: DESYREL  Take 50 mg by mouth at bedtime.   Vitamin D3  50 MCG Caps Take 1 capsule by mouth at bedtime.               Durable Medical Equipment  (From admission, onward)           Start     Ordered   11/28/24 1206  For home use only DME Walker  Once       Question:  Patient needs a walker to treat with the following condition  Answer:  Physical debility   11/28/24 1205   11/28/24 1206  For home use only DME 3 n 1  Once        11/28/24 1205              Discharge Care Instructions  (From admission, onward)           Start     Ordered   11/28/24 0000  Discharge wound care:       Comments: As per IR   11/28/24 1211           No Known Allergies Discharge Instructions     Call MD for:  difficulty breathing, headache or visual disturbances   Complete by: As directed    Call MD for:  extreme fatigue   Complete by: As directed    Call MD for:  persistant dizziness or light-headedness   Complete by: As directed    Call MD for:  redness, tenderness, or signs of infection (pain, swelling, redness, odor or green/yellow discharge around incision site)   Complete by: As directed    Call MD for:  severe uncontrolled pain   Complete by: As directed    Call MD for:  temperature >100.4   Complete by: As directed    Discharge instructions   Complete by: As directed    F/u with PCP in 1 wk F/u with Oncologist in 1 wk Repeat CBC with diff in 1 wk   Discharge wound care:   Complete by: As directed    As per IR   Increase activity slowly   Complete by: As directed        The results of significant diagnostics from this hospitalization (including imaging, microbiology, ancillary and laboratory) are listed below for reference.    Significant Diagnostic Studies: IR BONE MARROW BIOPSY & ASPIRATION Result Date: 11/28/2024 INDICATION: Multiple myeloma evaluation EXAM: FLUOROSCOPICALLY GUIDED RIGHT ILIAC BONE MARROW ASPIRATION AND CORE BIOPSY Date:  11/28/2024 11/28/2024 8:57 am Radiologist:  M. Frederic Specking, MD Guidance:   FLUOROSCOPIC FLUOROSCOPY: Fluoroscopy Time: 1 minutes 0 seconds (8.0 mGy). MEDICATIONS: 1% lidocaine  local ANESTHESIA/SEDATION: 0.5 mg IV Versed ; 25 mcg IV Fentanyl   Moderate Sedation Time:  10 minute The patient was continuously monitored during the procedure by the interventional radiology nurse under my direct supervision. CONTRAST:  None. COMPLICATIONS: None PROCEDURE: Informed consent was obtained from the patient following explanation of the procedure, risks, benefits and alternatives. The patient understands, agrees and consents for the procedure. All questions were addressed. A time out was performed. The patient was positioned prone and fluoroscopic localization was performed of the pelvis to demonstrate the iliac marrow spaces. Maximal barrier sterile technique utilized including caps, mask, sterile gowns, sterile gloves, large sterile drape, hand hygiene, and Betadine prep. Under sterile conditions and local anesthesia, an 11 gauge coaxial bone biopsy needle was advanced into the right iliac marrow space. Needle position was confirmed with fluoroscopic imaging. Initially, bone marrow aspiration was performed. Next, the 11 gauge outer cannula was utilized to obtain a right iliac bone marrow core biopsy. Needle was removed. Hemostasis was obtained with compression. The patient tolerated the procedure well. Samples were prepared with the cytotechnologist. No immediate complications. IMPRESSION: Fluoroscopically guided right iliac bone marrow aspiration and core biopsy. Electronically Signed   By: CHRISTELLA.  Shick M.D.   On: 11/28/2024 10:09   CT Chest W Contrast Result Date: 11/24/2024 EXAM: CT CHEST WITH CONTRAST 11/24/2024 07:40:35 AM TECHNIQUE: CT of the chest was performed with the administration of 75 mL of iohexol  (OMNIPAQUE ) 300 MG/ML solution. Multiplanar reformatted images are provided for review. Automated exposure control, iterative reconstruction, and/or weight based adjustment of the mA/kV was  utilized to reduce the radiation dose to as low as reasonably achievable. COMPARISON: CT abdomen and pelvis, thoracic and lumbar spine MRI reported separately today. CLINICAL HISTORY: 83 year old female with malignancy, unknown primary, and pathologic spine fractures. FINDINGS: MEDIASTINUM: Calcified coronary artery and aortic atherosclerosis. Heart size within normal limits. No pericardial effusion. The central airways are clear. Central pulmonary arteries appear to be normally enhancing. Moderate sized gastric hiatal hernia. Elevated right hemidiaphragm. LYMPH NODES: No mediastinal, hilar or axillary lymphadenopathy. LUNGS AND PLEURA: Evidence of centrilobular emphysema, mild in the upper lobes. Bilateral lung base mild bronchiectasis, chronic atelectasis and scarring in part related to chronic hiatal hernia and chronic elevation of the right hemidiaphragm. No active lung inflammation. No pleural effusion or pneumothorax. SOFT TISSUES/BONES: Diffusely abnormal bone mineralization throughout the thoracic spine with numerous thoracic compression fractures, detailed on MRI separately today. There is a mild degree of superimposed thoracic spinal hyperostosis with occasional interbody ankylosis (T3-T4, T9-T10). Heterogeneous bilateral ribs. No acute or pathologic rib fracture is identified. Streak artifact from left shoulder arthroplasty. UPPER ABDOMEN: Visible upper abdominal viscera are stable from CT abdomen and pelvis reported separately today. IMPRESSION: 1. Abnormal thoracic spine as detailed on MRI separately today. Heterogeneous ribs, but no definite acute or pathologic rib fracture identified. 2. No superimposed acute or malignant process identified in the lungs or mediastinum. 3. Lung scarring and atelectasis with a mild degree of underlying Emphysema. Aortic and coronary artery atherosclerosis. Moderate gastric hiatal hernia and chronic elevated right hemidiaphragm. Electronically signed by: Helayne Hurst MD  11/24/2024 09:09 AM EST RP Workstation: [id number]   MR Lumbar Spine W Wo Contrast Result Date: 11/24/2024 EXAM: MRI LUMBAR SPINE 11/24/2024 07:11:00 AM TECHNIQUE: Multiplanar multisequence MRI of the lumbar spine was performed without and with the administration of intravenous contrast. 6 mL (gadobutrol  (GADAVIST ) 1 MMOL/ML injection 6 mL GADOBUTROL  1 MMOL/ML IV SOLN). COMPARISON: Thoracic MRI 11/24/2024 reported separately. CT abdomen and pelvis 11/24/2024 reported separately. CLINICAL HISTORY: 83 year old female with  new widespread spinal compression fractures and diffuse heterogeneous skeletal mineralization on CT this morning. FINDINGS: Normal lumbar segmentation, concordant with thoracic numbering today. BONES AND ALIGNMENT: Diffusely heterogeneous T1 marrow signal throughout the visible spine. Heterogeneous marrow in the sacrum and pelvis although less affected (series 13 image 39). Lower thoracic spinal compression fractures redemonstrated. Lumbar spinal compression fractures range from the L1 level (74% loss of vertebral body height) to the L2 level (4% loss of vertebral body height). All levels including the central sacrum demonstrate diffuse increased STIR hyperintensity and heterogeneous hyperenhancement following contrast (series 14 image 6). Sacrum appears grossly intact. Retropulsion of the L1 posterior inferior endplate results in mild spinal stenosis seen on series 12 image 12. No extraosseous extension of tumor or soft tissue is identified in the lumbar spine. SPINAL CORD: Normal conus medullaris at L1. No signal abnormality in the visible lower thoracic spinal cord or conus. No abnormal intradural enhancement. No dural thickening. Normal cauda equina nerve roots. SOFT TISSUES: No paraspinal mass. Stable visible abdominal viscera. DEGENERATIVE: Pathologic compression fracture related mild spinal stenosis at the L1 level. Otherwise ordinary lumbar spine degeneration including disc bulging and  endplate spurring. No high grade lumbar spine stenosis. There is moderate lumbar neural foraminal stenosis at the right L1 and L4 nerve levels. IMPRESSION: 1. Diffuse abnormal marrow signal throughout the spine, as seen on Thoracic MRI. Sacrum and pelvis are less affected. Consider metastatic disease, lymphoproliferative disorder, or less likely multiple myeloma (given heterogeneous sclerosis by CT). 2. Lumbar compression fractures with loss of height ranging from 74% (L1 ) to 4% (L2). Mild spinal and moderate right neural foraminal stenosis at L1 due to retropulsion. No extraosseous tumor or soft tissue. 3. Negative conus medullaris and cauda equina. Electronically signed by: Helayne Hurst MD 11/24/2024 07:33 AM EST RP Workstation: [id number]   MR THORACIC SPINE W WO CONTRAST Result Date: 11/24/2024 EXAM: MRI THORACIC SPINE WITH AND WITHOUT INTRAVENOUS CONTRAST 11/24/2024 07:11:00 AM TECHNIQUE: Multiplanar multisequence MRI of the thoracic spine was performed with and without the administration of intravenous contrast. COMPARISON: CT abdomen and pelvis 11/24/2024. CLINICAL HISTORY: 83 year old female with diffusely abnormal skeletal bone mineralization and diffuse thoracic and lumbar compression fractures, new from May. FINDINGS: Limited sagittal imaging of the cervical spine demonstrates diffuse abnormality T1 hypointense marrow replacement, only partially sparing the anterior C2 vertebra (series 7 image 3). Thoracic segmentation appears to be normal. BONES AND ALIGNMENT: Heterogeneous diffusely abnormal T1 marrow signal throughout the thoracic vertebrae. Diffuse increased STIR signal within the thoracic vertebrae, including the levels T1 through T4 which appear to remain intact. From T5 through T12 there are thoracic compression fractures with loss of height ranging from T5 level (6% loss of vertebral body height) to the T7 level (48% loss of vertebral body height). No significant thoracic vertebral  retropulsion. Also on sagittal STIR images there is a discrete round T6 vertebral body lesion located posteriorly on the right with intense increased STIR signal (series 9 image 7) but is relatively hypoenhancing following contrast. Similar signal abnormality in the T6 lamina and spinous process there (series 9 image 9). Smaller T11 round 11 mm right posterior vertebral body lesion is heterogeneously enhancing on series 14 image 7. Similar rounded lower cervical vertebral lesions are partially visible such as at C7 on the left (series 9 image 10). Visible posterior ribs are also diffusely heterogeneous. There is no extraosseous tumor or soft tissue extension identified in the thoracic spine. SPINAL CORD: Normal thoracic spinal cord signal and morphology.  No abnormal intradural enhancement. No dural thickening. SOFT TISSUES: Negative visible soft tissues at the thoracic inlet, mediastinum. Stable visible upper abdominal viscera. DEGENERATIVE CHANGES: Thoracic spinal canal is capacious. Mild for age thoracic spine disc degeneration including a small central disc protrusion at T2-T3 (series 10 image 14). No thoracic spinal stenosis. IMPRESSION: 1. Widespread thoracic pathologic compression fractures: T5 through T12 with loss of height ranging from 6% to 48%. No retropulsion, extraosseous tumor, or other complicating features. 2. Underlying Diffuse abnormal spinal and rib marrow signal compatible with an infiltrative marrow process such as: metastatic disease, lymphoproliferative disorder, or less likely multiple myeloma (given heterogeneous sclerosis by CT). Multiple small, round and discrete STIR hyperintense vertebral lesions are noted, although not hyperenhancing. 3. No thoracic spinal stenosis. Normal thoracic spinal cord. Electronically signed by: Helayne Hurst MD 11/24/2024 07:24 AM EST RP Workstation: [id number]   CT ABDOMEN PELVIS W CONTRAST Result Date: 11/24/2024 EXAM: CT ABDOMEN AND PELVIS WITH CONTRAST  11/24/2024 04:55:30 AM TECHNIQUE: CT of the abdomen and pelvis was performed with the administration of 100 mL of iohexol  (OMNIPAQUE ) 300 MG/ML solution. Multiplanar reformatted images are provided for review. Automated exposure control, iterative reconstruction, and/or weight-based adjustment of the mA/kV was utilized to reduce the radiation dose to as low as reasonably achievable. COMPARISON: CT abdomen and pelvis 05/19/2024. CLINICAL HISTORY: 83 year old female. LLQ abdominal pain. FINDINGS: LOWER CHEST: No acute abnormality. HEART AND MEDIASTINUM: Advanced left coronary artery calcified atherosclerosis or stent on series 2 image 2. Stable heart size, upper limits of normal. No pericardial effusion. PLEURAL SPACES: No pleural effusion. LUNGS: Chronic elevation of the right hemidiaphragm. Chronic bilateral lung base atelectasis. LIVER: The liver is unremarkable. GALLBLADDER AND BILE DUCTS: Gallbladder is unremarkable. No biliary ductal dilatation. SPLEEN: No acute abnormality. PANCREAS: No acute abnormality. ADRENAL GLANDS: No acute abnormality. KIDNEYS, URETERS AND BLADDER: Renal parenchymal volume appears stable since May. Renal enhancement and contrast excretion appears symmetric and normal. No stones in the kidneys or ureters. No hydronephrosis. No perinephric or periureteral stranding. Urinary bladder is unremarkable. GI AND BOWEL: Moderate chronic gastric hiatal hernia. Intraabdominal stomach and duodenum appear negative. Widespread diverticulosis of the distal descent and sigmoid colon with no active inflammation. Less pronounced large bowel diverticula elsewhere. Fluid in the ascending colon to the hepatic flexure. No dilated or inflamed bowel loops. There is no bowel obstruction. APPENDIX: Normal gas containing appendix on series 2 image 41. PERITONEUM AND RETROPERITONEUM: No ascites. No free air. VASCULATURE: Advanced calcified aortic atherosclerosis. Major arterial structures and portal venous system  remain patent. ILIOFEMORAL ARTERIES: Less pronounced iliofemoral calcified atherosclerosis. LYMPH NODES: No lymphadenopathy. REPRODUCTIVE ORGANS: No acute abnormality. BONES AND SOFT TISSUES: Pronounced abnormal bone mineralization since May this year. At that time, L1 compression fracture was acute/subacute. There are now extensive lower thoracic and lumbar spinal compression fractures from the visible T7 through L4 levels, with associated bone mineralization heterogeneity, including heterogeneous sclerosis throughout the visible ribs and pelvis, which seem to remain intact. Retropulsion of bone at L1 results in mild spinal stenosis (sagittal image 53). No discrete destructive osseous lesion. Marked change in diffuse skeletal bone mineralization since May, and diffuse spinal compression fractures since that time. Constellation highly suspicious for a pathologic marrow infiltrative process. No discrete destructive bone lesion, but metastatic disease, myelofibrosis, or similar etiology. No paraspinal soft tissue mass. No focal soft tissue abnormality. IMPRESSION: 1. Marked change in the skeletal bone mineralization since May, with diffuse spinal compression fractures since that time. Constellation highly suspicious for a  pathologic marrow infiltrative process. Although no discrete destructive bone lesion, consider Metastatic disease, Myelofibrosis, or similar etiology. 2. Mild retropulsion of L1 resulting in mild spinal stenosis. 3. No superimposed acute or metastatic soft tissue abnormality identified in the abdomen or pelvis. Chronic aortic atherosclerosis, coronary artery atherosclerosis , moderate gastric hiatal hernia. Electronically signed by: Helayne Hurst MD 11/24/2024 05:20 AM EST RP Workstation: [id number]    Microbiology: No results found for this or any previous visit (from the past 240 hours).   Labs: CBC: Recent Labs  Lab 11/24/24 0247 11/25/24 0536 11/26/24 0503 11/27/24 0452 11/28/24 0529   WBC 4.6 4.5 6.6 3.5* 3.1*  NEUTROABS  --   --   --   --  1.5*  HGB 10.8* 10.1* 10.7* 9.3* 9.2*  HCT 32.8* 31.4* 35.1* 29.1* 28.7*  MCV 87.0 88.5 90.9 87.9 88.9  PLT 195 220 256 181 185   Basic Metabolic Panel: Recent Labs  Lab 11/24/24 0247 11/25/24 0536 11/26/24 0503 11/27/24 0452 11/28/24 0528  NA 133* 136 137 137 137  K 3.4* 4.4 4.2 4.1 4.6  CL 96* 97* 98 97* 98  CO2 23 28 28 29 30   GLUCOSE 98 120* 167* 113* 91  BUN 11 13 19 12 13   CREATININE 0.69 0.71 1.08* 0.71 0.73  CALCIUM 9.5 9.3 9.5 9.4 9.4  MG  --   --  1.8 1.6* 1.9  PHOS  --   --  5.0* 3.3 3.6   Liver Function Tests: Recent Labs  Lab 11/24/24 0247 11/25/24 0536  AST 31 28  ALT 9 8  ALKPHOS 112 108  BILITOT 0.4 0.3  PROT 7.4 7.1  ALBUMIN 4.0 3.9   Recent Labs  Lab 11/24/24 0247  LIPASE 34   No results for input(s): AMMONIA in the last 168 hours. Cardiac Enzymes: No results for input(s): CKTOTAL, CKMB, CKMBINDEX, TROPONINI in the last 168 hours. BNP (last 3 results) No results for input(s): BNP in the last 8760 hours. CBG: No results for input(s): GLUCAP in the last 168 hours.  Time spent: 35 minutes  Signed:  Elvan Sor  [facility]  11/28/2024 12:11 PM

## 2024-11-28 NOTE — Progress Notes (Signed)
   Brief Progress Note   _____________________________________________________________________________________________________________  Patient Name: Samantha Ford Patient DOB: 06/02/1941 Date: @TODAY @      Data: Chart reviewed. Pt. S/p iliac bone marrow biopsy. Admitted with intractable lower back pain.    Action: EPIC secure chat with primary RN and MD regarding potential need for PT/OT if medically indicated for patient.    Response:  MD placed orders for PT/OT evaluations.  _____________________________________________________________________________________________________________  [facility] RN Expeditor Leman Martinek, Corean Lobstein Please contact us  directly via secure chat (search for [facility]) or by calling us  at [phone] [facility]).

## 2024-11-28 NOTE — TOC CM/SW Note (Addendum)
 Transition of Care (TOC) CM/SW Note   Patient is not able to walk the distance required to go the bathroom, or he/she is unable to safely negotiate stairs required to access the bathroom.  A 3in1 BSC will alleviate this problem     The patient has a mobility limitation that significantly impairs their ability to participant in one or more mobility-related activities of daily living in the home. The patient is able to safely use the walker. The functional mobility deficit can be sufficiently resolved by use of a walker.

## 2024-11-28 NOTE — Plan of Care (Signed)
   Problem: Education: Goal: Knowledge of General Education information will improve Description: Including pain rating scale, medication(s)/side effects and non-pharmacologic comfort measures Outcome: Progressing   Problem: Health Behavior/Discharge Planning: Goal: Ability to manage health-related needs will improve Outcome: Progressing   Problem: Nutrition: Goal: Adequate nutrition will be maintained Outcome: Progressing

## 2024-11-28 NOTE — Procedures (Signed)
 Interventional Radiology Procedure Note  Procedure: FLUORO RT ILIAC BM ASP AND CORE    Complications: None  Estimated Blood Loss:  MIN  Findings: 11 G CORE AND ASP    M. Ruel Favors, MD

## 2024-11-28 NOTE — Progress Notes (Signed)
 Patient clinically stable post IR BMB per Dr Vanice, tolerated well. Vitals stable post procedure. Received Versed  0.5 mg along with Fentanyl  25 mcg IV for procedure. Awake/alert and oriented post procedure. Report given to Jann Parker Rn post procedure/specials/11

## 2024-11-28 NOTE — Progress Notes (Signed)
 OT Cancellation Note  Patient Details Name: Samantha Ford MRN: [id number] DOB: [birth year]   Cancelled Treatment:    Reason Eval/Treat Not Completed: OT screened, no needs identified, will sign off. Pt declined OT evaluation and feels she is at baseline with self care needs at this time. OT to complete orders.   Jennie Izetta SQUIBB 11/28/2024, 1:49 PM

## 2024-11-28 NOTE — TOC Progression Note (Signed)
 Transition of Care [facility]) - Progression Note    Patient Details  Name: Samantha Ford MRN: [id number] Date of Birth: 1941-08-22  [facility] of [facility] CM/SW Contact  Alvaro Louder, [place] Phone Number: 11/28/2024, 3:09 PM  Clinical Narrative:   LCSWA discussed PT recommendation of HH Patient was agreeable. LCSWA reached out to [facility] admissions coordinator an started service for patient. The patient reported that he would have a friend come pick him up at discharge. LCSWA discussed PT recommendation  of RW and BSC 3in1. Patient was agreeable, LCSWA reached out to [employer] coordinator to set up equipment delivery.  TOC to follow for discharge                     Expected Discharge Plan and Services         Expected Discharge Date: 11/28/24                                     Social Drivers of Health (SDOH) Interventions SDOH Screenings   Food Insecurity: No Food Insecurity (11/24/2024)  Housing: Low Risk  (11/24/2024)  Transportation Needs: No Transportation Needs (11/24/2024)  Utilities: Not At Risk (11/24/2024)  Financial Resource Strain: Low Risk  (04/19/2024)   Received from [facility]  Social Connections: Moderately Integrated (11/24/2024)  Tobacco Use: Low Risk  (11/24/2024)    Readmission Risk Interventions     No data to display

## 2024-11-28 NOTE — Plan of Care (Signed)

## 2024-12-01 ENCOUNTER — Encounter: Payer: Self-pay | Admitting: Oncology

## 2024-12-01 ENCOUNTER — Inpatient Hospital Stay: Attending: Oncology | Admitting: Oncology

## 2024-12-01 ENCOUNTER — Inpatient Hospital Stay: Admitting: Hospice and Palliative Medicine

## 2024-12-01 VITALS — BP 143/79 | HR 88 | Temp 98.2°F | Resp 18 | Ht 66.0 in | Wt 142.0 lb

## 2024-12-01 DIAGNOSIS — Z515 Encounter for palliative care: Secondary | ICD-10-CM | POA: Diagnosis not present

## 2024-12-01 DIAGNOSIS — Z853 Personal history of malignant neoplasm of breast: Secondary | ICD-10-CM | POA: Diagnosis not present

## 2024-12-01 DIAGNOSIS — Z79811 Long term (current) use of aromatase inhibitors: Secondary | ICD-10-CM | POA: Diagnosis not present

## 2024-12-01 DIAGNOSIS — Z170 Estrogen receptor positive status [ER+]: Secondary | ICD-10-CM | POA: Insufficient documentation

## 2024-12-01 DIAGNOSIS — Z9011 Acquired absence of right breast and nipple: Secondary | ICD-10-CM | POA: Diagnosis not present

## 2024-12-01 DIAGNOSIS — R52 Pain, unspecified: Secondary | ICD-10-CM

## 2024-12-01 DIAGNOSIS — C50919 Malignant neoplasm of unspecified site of unspecified female breast: Secondary | ICD-10-CM | POA: Insufficient documentation

## 2024-12-01 DIAGNOSIS — D72819 Decreased white blood cell count, unspecified: Secondary | ICD-10-CM | POA: Diagnosis not present

## 2024-12-01 DIAGNOSIS — D649 Anemia, unspecified: Secondary | ICD-10-CM | POA: Diagnosis not present

## 2024-12-01 DIAGNOSIS — Z79891 Long term (current) use of opiate analgesic: Secondary | ICD-10-CM | POA: Insufficient documentation

## 2024-12-01 DIAGNOSIS — Z1721 Progesterone receptor positive status: Secondary | ICD-10-CM | POA: Diagnosis not present

## 2024-12-01 DIAGNOSIS — M4850XA Collapsed vertebra, not elsewhere classified, site unspecified, initial encounter for fracture: Secondary | ICD-10-CM | POA: Diagnosis not present

## 2024-12-01 DIAGNOSIS — M549 Dorsalgia, unspecified: Secondary | ICD-10-CM | POA: Diagnosis not present

## 2024-12-01 MED ORDER — HYDROCODONE-ACETAMINOPHEN 5-325 MG PO TABS: 1.0000 | ORAL_TABLET | ORAL | 0 refills | Status: DC | PRN

## 2024-12-01 MED ORDER — LETROZOLE 2.5 MG PO TABS: 2.5000 mg | ORAL_TABLET | Freq: Every day | ORAL | 3 refills | Status: AC

## 2024-12-01 NOTE — Progress Notes (Signed)
 Palliative Medicine [facility] Telephone:([phone]) [phone] Fax:([phone]) [phone]   Name: Samantha Ford Date: 12/01/2024 MRN: [id number]  DOB: 1941/04/26  Patient Care Team: Diedra Lame, MD as PCP - General (Family Medicine) Jacobo Evalene PARAS, MD as Consulting Physician (Oncology)    REASON FOR CONSULTATION: Samantha Ford is a 83 y.o. female with multiple medical problems including OA, remote history of breast cancer status postmastectomy in 2015, who presented to the emergency department with complaint of low back pain.  CT of the abdomen showed multiple diffuse spinal compression pathologic fractures.  Palliative care was consulted to address goals and manage ongoing symptoms.   SOCIAL HISTORY:     reports that she has never smoked. She has never used smokeless tobacco. She reports that she does not currently use alcohol. She reports that she does not use drugs.  Patient is widowed.  Lives at home with her grandson.  She has 2 sons and multiple grandchildren who live nearby.  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  ADVANCE DIRECTIVES:  Does not have  CODE STATUS:   PAST MEDICAL HISTORY: Past Medical History:  Diagnosis Date   Breast cancer (HCC) 10/22/2013   Mastectomy in 2015   Cancer [facility])    right breast 12/06/2013   GERD (gastroesophageal reflux disease)    Hyperlipidemia    Insomnia    Macular degeneration of right eye    Osteoarthritis    Pneumonia    Pneumothorax     PAST SURGICAL HISTORY:  Past Surgical History:  Procedure Laterality Date   ANKLE FRACTURE SURGERY Right 09/30/2010   BREAST BIOPSY Left 11/29/2015   neg, ribbon clip   BREAST BIOPSY Left 11/29/2015   neg, coil clip    IR BONE MARROW BIOPSY & ASPIRATION  11/28/2024   KNEE ARTHROSCOPY Right 04/04/2008   MASTECTOMY Right 12/29/2013   PARTIAL MASTECTOMY WITH AXILLARY SENTINEL LYMPH NODE BIOPSY Right 12/06/2013   REVERSE  SHOULDER ARTHROPLASTY Left 08/06/2023   Procedure: REVERSE SHOULDER ARTHROPLASTY WITH BICEPS TENODESIS.;  Surgeon: Edie Norleen PARAS, MD;  Location: [facility] ORS;  Service: Orthopedics;  Laterality: Left;    HEMATOLOGY/ONCOLOGY HISTORY:  Oncology History   No problem history exists.    ALLERGIES:  has no known allergies.  MEDICATIONS:  Current Outpatient Medications  Medication Sig Dispense Refill   acetaminophen  (TYLENOL ) 325 MG tablet Take 2 tablets (650 mg total) by mouth every 8 (eight) hours as needed for mild pain (pain score 1-3), fever or headache. (Patient not taking: Reported on 12/01/2024)     cetirizine (ZYRTEC) 10 MG tablet Take 10 mg by mouth daily.     Cholecalciferol (VITAMIN D3) 50 MCG CAPS Take 1 capsule by mouth at bedtime.     diphenhydrAMINE (BENADRYL) 25 MG tablet Take 25 mg by mouth at bedtime.     HYDROcodone -acetaminophen  (NORCO/VICODIN) 5-325 MG tablet Take 1 tablet by mouth every 4 (four) hours as needed for moderate pain (pain score 4-6) or severe pain (pain score 7-10). 60 tablet 0   ibandronate (BONIVA) 150 MG tablet Take 150 mg by mouth every 30 (thirty) days.     ibuprofen (ADVIL) 200 MG tablet Take 2 tablets (400 mg total) by mouth every 8 (eight) hours as needed.     lidocaine  (LIDODERM ) 5 % Place 1 patch onto the skin daily. Remove & Discard patch within 12 hours or as directed by MD (Patient not taking: Reported on 12/01/2024) 30 patch  0   Magnesium  250 MG TABS Take 1 tablet by mouth at bedtime.     Multiple Vitamins-Minerals (MULTIVITAMIN ADULTS 50+ PO) Take 1 tablet by mouth at bedtime. (Patient not taking: Reported on 12/01/2024)     Multiple Vitamins-Minerals (PRESERVISION AREDS 2) CAPS Take 1 capsule by mouth at bedtime. (Patient not taking: Reported on 12/01/2024)     Omega-3 Fatty Acids (OMEGA-3 FISH OIL) 1200 MG CAPS Take 1 capsule by mouth at bedtime.     omeprazole (PRILOSEC) 40 MG capsule Take 40 mg by mouth daily.     pravastatin (PRAVACHOL) 20 MG  tablet Take 20 mg by mouth at bedtime.     tiZANidine (ZANAFLEX) 2 MG tablet Take 2 mg by mouth every 8 (eight) hours as needed.     traZODone  (DESYREL ) 50 MG tablet Take 50 mg by mouth at bedtime.     No current facility-administered medications for this visit.    VITAL SIGNS: There were no vitals taken for this visit. There were no vitals filed for this visit.  Estimated body mass index is 22.92 kg/m as calculated from the following:   Height as of an earlier encounter on 12/01/24: 5' 6 (1.676 m).   Weight as of an earlier encounter on 12/01/24: 142 lb (64.4 kg).  LABS: CBC:    Component Value Date/Time   WBC 3.1 (L) 11/28/2024 0529   HGB 9.2 (L) 11/28/2024 0529   HCT 28.7 (L) 11/28/2024 0529   PLT 185 11/28/2024 0529   MCV 88.9 11/28/2024 0529   NEUTROABS 1.5 (L) 11/28/2024 0529   LYMPHSABS 1.1 11/28/2024 0529   MONOABS 0.3 11/28/2024 0529   EOSABS 0.1 11/28/2024 0529   BASOSABS 0.0 11/28/2024 0529   Comprehensive Metabolic Panel:    Component Value Date/Time   NA 137 11/28/2024 0528   K 4.6 11/28/2024 0528   CL 98 11/28/2024 0528   CO2 30 11/28/2024 0528   BUN 13 11/28/2024 0528   CREATININE 0.73 11/28/2024 0528   GLUCOSE 91 11/28/2024 0528   CALCIUM 9.4 11/28/2024 0528   AST 28 11/25/2024 0536   ALT 8 11/25/2024 0536   ALKPHOS 108 11/25/2024 0536   BILITOT 0.3 11/25/2024 0536   PROT 7.1 11/25/2024 0536   ALBUMIN 3.9 11/25/2024 0536    RADIOGRAPHIC STUDIES: IR BONE MARROW BIOPSY & ASPIRATION Result Date: 11/28/2024 INDICATION: Multiple myeloma evaluation EXAM: FLUOROSCOPICALLY GUIDED RIGHT ILIAC BONE MARROW ASPIRATION AND CORE BIOPSY Date:  11/28/2024 11/28/2024 8:57 am Radiologist:  CHRISTELLA. Frederic Specking, MD Guidance:  FLUOROSCOPIC FLUOROSCOPY: Fluoroscopy Time: 1 minutes 0 seconds (8.0 mGy). MEDICATIONS: 1% lidocaine  local ANESTHESIA/SEDATION: 0.5 mg IV Versed ; 25 mcg IV Fentanyl  Moderate Sedation Time:  10 minute The patient was continuously monitored during the  procedure by the interventional radiology nurse under my direct supervision. CONTRAST:  None. COMPLICATIONS: None PROCEDURE: Informed consent was obtained from the patient following explanation of the procedure, risks, benefits and alternatives. The patient understands, agrees and consents for the procedure. All questions were addressed. A time out was performed. The patient was positioned prone and fluoroscopic localization was performed of the pelvis to demonstrate the iliac marrow spaces. Maximal barrier sterile technique utilized including caps, mask, sterile gowns, sterile gloves, large sterile drape, hand hygiene, and Betadine prep. Under sterile conditions and local anesthesia, an 11 gauge coaxial bone biopsy needle was advanced into the right iliac marrow space. Needle position was confirmed with fluoroscopic imaging. Initially, bone marrow aspiration was performed. Next, the 11 gauge outer cannula was utilized  to obtain a right iliac bone marrow core biopsy. Needle was removed. Hemostasis was obtained with compression. The patient tolerated the procedure well. Samples were prepared with the cytotechnologist. No immediate complications. IMPRESSION: Fluoroscopically guided right iliac bone marrow aspiration and core biopsy. Electronically Signed   By: CHRISTELLA.  Shick M.D.   On: 11/28/2024 10:09   CT Chest W Contrast Result Date: 11/24/2024 EXAM: CT CHEST WITH CONTRAST 11/24/2024 07:40:35 AM TECHNIQUE: CT of the chest was performed with the administration of 75 mL of iohexol  (OMNIPAQUE ) 300 MG/ML solution. Multiplanar reformatted images are provided for review. Automated exposure control, iterative reconstruction, and/or weight based adjustment of the mA/kV was utilized to reduce the radiation dose to as low as reasonably achievable. COMPARISON: CT abdomen and pelvis, thoracic and lumbar spine MRI reported separately today. CLINICAL HISTORY: 83 year old female with malignancy, unknown primary, and pathologic  spine fractures. FINDINGS: MEDIASTINUM: Calcified coronary artery and aortic atherosclerosis. Heart size within normal limits. No pericardial effusion. The central airways are clear. Central pulmonary arteries appear to be normally enhancing. Moderate sized gastric hiatal hernia. Elevated right hemidiaphragm. LYMPH NODES: No mediastinal, hilar or axillary lymphadenopathy. LUNGS AND PLEURA: Evidence of centrilobular emphysema, mild in the upper lobes. Bilateral lung base mild bronchiectasis, chronic atelectasis and scarring in part related to chronic hiatal hernia and chronic elevation of the right hemidiaphragm. No active lung inflammation. No pleural effusion or pneumothorax. SOFT TISSUES/BONES: Diffusely abnormal bone mineralization throughout the thoracic spine with numerous thoracic compression fractures, detailed on MRI separately today. There is a mild degree of superimposed thoracic spinal hyperostosis with occasional interbody ankylosis (T3-T4, T9-T10). Heterogeneous bilateral ribs. No acute or pathologic rib fracture is identified. Streak artifact from left shoulder arthroplasty. UPPER ABDOMEN: Visible upper abdominal viscera are stable from CT abdomen and pelvis reported separately today. IMPRESSION: 1. Abnormal thoracic spine as detailed on MRI separately today. Heterogeneous ribs, but no definite acute or pathologic rib fracture identified. 2. No superimposed acute or malignant process identified in the lungs or mediastinum. 3. Lung scarring and atelectasis with a mild degree of underlying Emphysema. Aortic and coronary artery atherosclerosis. Moderate gastric hiatal hernia and chronic elevated right hemidiaphragm. Electronically signed by: Helayne Hurst MD 11/24/2024 09:09 AM EST RP Workstation: [id number]   MR Lumbar Spine W Wo Contrast Result Date: 11/24/2024 EXAM: MRI LUMBAR SPINE 11/24/2024 07:11:00 AM TECHNIQUE: Multiplanar multisequence MRI of the lumbar spine was performed without and with the  administration of intravenous contrast. 6 mL (gadobutrol  (GADAVIST ) 1 MMOL/ML injection 6 mL GADOBUTROL  1 MMOL/ML IV SOLN). COMPARISON: Thoracic MRI 11/24/2024 reported separately. CT abdomen and pelvis 11/24/2024 reported separately. CLINICAL HISTORY: 83 year old female with new widespread spinal compression fractures and diffuse heterogeneous skeletal mineralization on CT this morning. FINDINGS: Normal lumbar segmentation, concordant with thoracic numbering today. BONES AND ALIGNMENT: Diffusely heterogeneous T1 marrow signal throughout the visible spine. Heterogeneous marrow in the sacrum and pelvis although less affected (series 13 image 39). Lower thoracic spinal compression fractures redemonstrated. Lumbar spinal compression fractures range from the L1 level (74% loss of vertebral body height) to the L2 level (4% loss of vertebral body height). All levels including the central sacrum demonstrate diffuse increased STIR hyperintensity and heterogeneous hyperenhancement following contrast (series 14 image 6). Sacrum appears grossly intact. Retropulsion of the L1 posterior inferior endplate results in mild spinal stenosis seen on series 12 image 12. No extraosseous extension of tumor or soft tissue is identified in the lumbar spine. SPINAL CORD: Normal conus medullaris at L1. No  signal abnormality in the visible lower thoracic spinal cord or conus. No abnormal intradural enhancement. No dural thickening. Normal cauda equina nerve roots. SOFT TISSUES: No paraspinal mass. Stable visible abdominal viscera. DEGENERATIVE: Pathologic compression fracture related mild spinal stenosis at the L1 level. Otherwise ordinary lumbar spine degeneration including disc bulging and endplate spurring. No high grade lumbar spine stenosis. There is moderate lumbar neural foraminal stenosis at the right L1 and L4 nerve levels. IMPRESSION: 1. Diffuse abnormal marrow signal throughout the spine, as seen on Thoracic MRI. Sacrum and pelvis  are less affected. Consider metastatic disease, lymphoproliferative disorder, or less likely multiple myeloma (given heterogeneous sclerosis by CT). 2. Lumbar compression fractures with loss of height ranging from 74% (L1 ) to 4% (L2). Mild spinal and moderate right neural foraminal stenosis at L1 due to retropulsion. No extraosseous tumor or soft tissue. 3. Negative conus medullaris and cauda equina. Electronically signed by: Helayne Hurst MD 11/24/2024 07:33 AM EST RP Workstation: [id number]   MR THORACIC SPINE W WO CONTRAST Result Date: 11/24/2024 EXAM: MRI THORACIC SPINE WITH AND WITHOUT INTRAVENOUS CONTRAST 11/24/2024 07:11:00 AM TECHNIQUE: Multiplanar multisequence MRI of the thoracic spine was performed with and without the administration of intravenous contrast. COMPARISON: CT abdomen and pelvis 11/24/2024. CLINICAL HISTORY: 83 year old female with diffusely abnormal skeletal bone mineralization and diffuse thoracic and lumbar compression fractures, new from May. FINDINGS: Limited sagittal imaging of the cervical spine demonstrates diffuse abnormality T1 hypointense marrow replacement, only partially sparing the anterior C2 vertebra (series 7 image 3). Thoracic segmentation appears to be normal. BONES AND ALIGNMENT: Heterogeneous diffusely abnormal T1 marrow signal throughout the thoracic vertebrae. Diffuse increased STIR signal within the thoracic vertebrae, including the levels T1 through T4 which appear to remain intact. From T5 through T12 there are thoracic compression fractures with loss of height ranging from T5 level (6% loss of vertebral body height) to the T7 level (48% loss of vertebral body height). No significant thoracic vertebral retropulsion. Also on sagittal STIR images there is a discrete round T6 vertebral body lesion located posteriorly on the right with intense increased STIR signal (series 9 image 7) but is relatively hypoenhancing following contrast. Similar signal abnormality in the  T6 lamina and spinous process there (series 9 image 9). Smaller T11 round 11 mm right posterior vertebral body lesion is heterogeneously enhancing on series 14 image 7. Similar rounded lower cervical vertebral lesions are partially visible such as at C7 on the left (series 9 image 10). Visible posterior ribs are also diffusely heterogeneous. There is no extraosseous tumor or soft tissue extension identified in the thoracic spine. SPINAL CORD: Normal thoracic spinal cord signal and morphology. No abnormal intradural enhancement. No dural thickening. SOFT TISSUES: Negative visible soft tissues at the thoracic inlet, mediastinum. Stable visible upper abdominal viscera. DEGENERATIVE CHANGES: Thoracic spinal canal is capacious. Mild for age thoracic spine disc degeneration including a small central disc protrusion at T2-T3 (series 10 image 14). No thoracic spinal stenosis. IMPRESSION: 1. Widespread thoracic pathologic compression fractures: T5 through T12 with loss of height ranging from 6% to 48%. No retropulsion, extraosseous tumor, or other complicating features. 2. Underlying Diffuse abnormal spinal and rib marrow signal compatible with an infiltrative marrow process such as: metastatic disease, lymphoproliferative disorder, or less likely multiple myeloma (given heterogeneous sclerosis by CT). Multiple small, round and discrete STIR hyperintense vertebral lesions are noted, although not hyperenhancing. 3. No thoracic spinal stenosis. Normal thoracic spinal cord. Electronically signed by: Helayne Hurst MD 11/24/2024 07:24 AM EST  RP Workstation: [id number]   CT ABDOMEN PELVIS W CONTRAST Result Date: 11/24/2024 EXAM: CT ABDOMEN AND PELVIS WITH CONTRAST 11/24/2024 04:55:30 AM TECHNIQUE: CT of the abdomen and pelvis was performed with the administration of 100 mL of iohexol  (OMNIPAQUE ) 300 MG/ML solution. Multiplanar reformatted images are provided for review. Automated exposure control, iterative reconstruction, and/or  weight-based adjustment of the mA/kV was utilized to reduce the radiation dose to as low as reasonably achievable. COMPARISON: CT abdomen and pelvis 05/19/2024. CLINICAL HISTORY: 83 year old female. LLQ abdominal pain. FINDINGS: LOWER CHEST: No acute abnormality. HEART AND MEDIASTINUM: Advanced left coronary artery calcified atherosclerosis or stent on series 2 image 2. Stable heart size, upper limits of normal. No pericardial effusion. PLEURAL SPACES: No pleural effusion. LUNGS: Chronic elevation of the right hemidiaphragm. Chronic bilateral lung base atelectasis. LIVER: The liver is unremarkable. GALLBLADDER AND BILE DUCTS: Gallbladder is unremarkable. No biliary ductal dilatation. SPLEEN: No acute abnormality. PANCREAS: No acute abnormality. ADRENAL GLANDS: No acute abnormality. KIDNEYS, URETERS AND BLADDER: Renal parenchymal volume appears stable since May. Renal enhancement and contrast excretion appears symmetric and normal. No stones in the kidneys or ureters. No hydronephrosis. No perinephric or periureteral stranding. Urinary bladder is unremarkable. GI AND BOWEL: Moderate chronic gastric hiatal hernia. Intraabdominal stomach and duodenum appear negative. Widespread diverticulosis of the distal descent and sigmoid colon with no active inflammation. Less pronounced large bowel diverticula elsewhere. Fluid in the ascending colon to the hepatic flexure. No dilated or inflamed bowel loops. There is no bowel obstruction. APPENDIX: Normal gas containing appendix on series 2 image 41. PERITONEUM AND RETROPERITONEUM: No ascites. No free air. VASCULATURE: Advanced calcified aortic atherosclerosis. Major arterial structures and portal venous system remain patent. ILIOFEMORAL ARTERIES: Less pronounced iliofemoral calcified atherosclerosis. LYMPH NODES: No lymphadenopathy. REPRODUCTIVE ORGANS: No acute abnormality. BONES AND SOFT TISSUES: Pronounced abnormal bone mineralization since May this year. At that time, L1  compression fracture was acute/subacute. There are now extensive lower thoracic and lumbar spinal compression fractures from the visible T7 through L4 levels, with associated bone mineralization heterogeneity, including heterogeneous sclerosis throughout the visible ribs and pelvis, which seem to remain intact. Retropulsion of bone at L1 results in mild spinal stenosis (sagittal image 53). No discrete destructive osseous lesion. Marked change in diffuse skeletal bone mineralization since May, and diffuse spinal compression fractures since that time. Constellation highly suspicious for a pathologic marrow infiltrative process. No discrete destructive bone lesion, but metastatic disease, myelofibrosis, or similar etiology. No paraspinal soft tissue mass. No focal soft tissue abnormality. IMPRESSION: 1. Marked change in the skeletal bone mineralization since May, with diffuse spinal compression fractures since that time. Constellation highly suspicious for a pathologic marrow infiltrative process. Although no discrete destructive bone lesion, consider Metastatic disease, Myelofibrosis, or similar etiology. 2. Mild retropulsion of L1 resulting in mild spinal stenosis. 3. No superimposed acute or metastatic soft tissue abnormality identified in the abdomen or pelvis. Chronic aortic atherosclerosis, coronary artery atherosclerosis , moderate gastric hiatal hernia. Electronically signed by: Helayne Hurst MD 11/24/2024 05:20 AM EST RP Workstation: [id number]    PERFORMANCE STATUS (ECOG) : 1 - Symptomatic but completely ambulatory  Review of Systems Unless otherwise noted, a complete review of systems is negative.  Physical Exam General: NAD Pulmonary: unlabored Extremities: no edema, no joint deformities Skin: no rashes Neurological: Weakness but otherwise nonfocal  IMPRESSION: Post Hospital follow-up.  Patient accompanied by son.  Granddaughter participated in visit via phone.  Pathology still pending.  CA  27.29 greater than 300.  Patient  reports that she is doing well since returning home.  She says that PT evaluated and did not feel that she needed to be followed by PT or OT.  She says that her pain is better controlled as long as she is taking Norco every 6 hours.  She denies any adverse effects from pain medications.  Reports having regular bowel movements.  Appetite is fair.  Discussed high-calorie/high-protein foods and adding oral nutritional supplements.  Patient would likely benefit from evaluation by radiation oncology.  Also consider bisphosphonates.  PLAN: - Continue current scope of treatment - Norco 5-325mg  every 4 hours as needed #60 - PDMP reviewed - Daily bowel regimen - Oral nutritional supplements - Referrals to nutrition and social work - RTC 1 month  Case and plan discussed with Dr. Jacobo   Patient expressed understanding and was in agreement with this plan. She also understands that She can call the clinic at any time with any questions, concerns, or complaints.     Time Total: 15 minutes  Visit consisted of counseling and education dealing with the complex and emotionally intense issues of symptom management and palliative care in the setting of serious and potentially life-threatening illness.Greater than 50%  of this time was spent counseling and coordinating care related to the above assessment and plan.  Signed by: Fonda Mower, PhD, NP-C

## 2024-12-01 NOTE — Progress Notes (Unsigned)
 Patient has to wear this special back brace 24/7 except for when she goes to shower, due to getting fractures very easily. Appetite is not very good. Her pain usually stays pretty constant through out the day, rates at about an 8, she is taking the hydrocodone  5 mg every 6 hours, and within about 3 hours in she takes an Advil.

## 2024-12-02 ENCOUNTER — Ambulatory Visit (INDEPENDENT_AMBULATORY_CARE_PROVIDER_SITE_OTHER): Admitting: Neurosurgery

## 2024-12-02 ENCOUNTER — Encounter: Payer: Self-pay | Admitting: Oncology

## 2024-12-02 DIAGNOSIS — S32010A Wedge compression fracture of first lumbar vertebra, initial encounter for closed fracture: Secondary | ICD-10-CM

## 2024-12-02 DIAGNOSIS — S22000D Wedge compression fracture of unspecified thoracic vertebra, subsequent encounter for fracture with routine healing: Secondary | ICD-10-CM | POA: Diagnosis not present

## 2024-12-02 DIAGNOSIS — C50919 Malignant neoplasm of unspecified site of unspecified female breast: Secondary | ICD-10-CM | POA: Insufficient documentation

## 2024-12-02 LAB — SURGICAL PATHOLOGY

## 2024-12-02 NOTE — Progress Notes (Signed)
 [facility]  [phone] [phone]  ID: Samantha Ford OB: Aug 26, 1941  MR#: [id number]  RDW#:[id number]  Patient Care Team: Diedra Lame, MD as PCP - General (Family Medicine) Jacobo Evalene PARAS, MD as Consulting Physician (Oncology)  CHIEF COMPLAINT: Recurrent ER/PR positive metastatic breast cancer.  INTERVAL HISTORY: Patient returns to clinic today for hospital follow-up, discussion of her pathology and laboratory results, and treatment planning.  She continues to have back pain and wearing a brace, but otherwise feels well.  She has no neurologic complaints.  She denies any recent fevers or illnesses.  She has a good appetite and denies weight loss.  She has no chest pain, shortness of breath, cough, or hemoptysis.  She denies any nausea, vomiting, constipation, or diarrhea.  She has no urinary complaints.  Patient offers no further specific complaints today.  REVIEW OF SYSTEMS:   Review of Systems  Constitutional: Negative.  Negative for fever, malaise/fatigue and weight loss.  Respiratory: Negative.  Negative for cough, hemoptysis and shortness of breath.   Cardiovascular: Negative.  Negative for chest pain and leg swelling.  Gastrointestinal: Negative.  Negative for abdominal pain.  Genitourinary: Negative.  Negative for dysuria.  Musculoskeletal:  Positive for back pain.  Skin: Negative.  Negative for rash.  Neurological: Negative.  Negative for dizziness, focal weakness, weakness and headaches.  Psychiatric/Behavioral: Negative.  The patient is not nervous/anxious.     As per HPI. Otherwise, a complete review of systems is negative.  PAST MEDICAL HISTORY: Past Medical History:  Diagnosis Date   Breast cancer (HCC) 10/22/2013   Mastectomy in 2015   Cancer [facility])    right breast 12/06/2013   GERD (gastroesophageal reflux disease)    Hyperlipidemia    Insomnia    Macular degeneration of right eye    Osteoarthritis    Pneumonia     Pneumothorax     PAST SURGICAL HISTORY: Past Surgical History:  Procedure Laterality Date   ANKLE FRACTURE SURGERY Right 09/30/2010   BREAST BIOPSY Left 11/29/2015   neg, ribbon clip   BREAST BIOPSY Left 11/29/2015   neg, coil clip    IR BONE MARROW BIOPSY & ASPIRATION  11/28/2024   KNEE ARTHROSCOPY Right 04/04/2008   MASTECTOMY Right 12/29/2013   PARTIAL MASTECTOMY WITH AXILLARY SENTINEL LYMPH NODE BIOPSY Right 12/06/2013   REVERSE SHOULDER ARTHROPLASTY Left 08/06/2023   Procedure: REVERSE SHOULDER ARTHROPLASTY WITH BICEPS TENODESIS.;  Surgeon: Edie Norleen PARAS, MD;  Location: [facility] ORS;  Service: Orthopedics;  Laterality: Left;    FAMILY HISTORY: Family History  Problem Relation Age of Onset   Colon cancer Sister    Cancer Brother    Breast cancer Neg Hx     ADVANCED DIRECTIVES (Y/N):  N  HEALTH MAINTENANCE: Social History[1]   Colonoscopy:  PAP:  Bone density:  Lipid panel:  Allergies[2]  Current Outpatient Medications  Medication Sig Dispense Refill   cetirizine (ZYRTEC) 10 MG tablet Take 10 mg by mouth daily.     Cholecalciferol (VITAMIN D3) 50 MCG CAPS Take 1 capsule by mouth at bedtime.     diphenhydrAMINE (BENADRYL) 25 MG tablet Take 25 mg by mouth at bedtime.     ibandronate (BONIVA) 150 MG tablet Take 150 mg by mouth every 30 (thirty) days.     ibuprofen (ADVIL) 200 MG tablet Take 2 tablets (400 mg total) by mouth every 8 (eight) hours as needed.     letrozole (FEMARA) 2.5 MG tablet Take 1 tablet (2.5 mg total) by mouth daily.  90 tablet 3   Magnesium  250 MG TABS Take 1 tablet by mouth at bedtime.     Omega-3 Fatty Acids (OMEGA-3 FISH OIL) 1200 MG CAPS Take 1 capsule by mouth at bedtime.     omeprazole (PRILOSEC) 40 MG capsule Take 40 mg by mouth daily.     pravastatin (PRAVACHOL) 20 MG tablet Take 20 mg by mouth at bedtime.     tiZANidine (ZANAFLEX) 2 MG tablet Take 2 mg by mouth every 8 (eight) hours as needed.     traZODone  (DESYREL ) 50 MG tablet Take 50  mg by mouth at bedtime.     acetaminophen  (TYLENOL ) 325 MG tablet Take 2 tablets (650 mg total) by mouth every 8 (eight) hours as needed for mild pain (pain score 1-3), fever or headache. (Patient not taking: Reported on 12/01/2024)     HYDROcodone -acetaminophen  (NORCO/VICODIN) 5-325 MG tablet Take 1 tablet by mouth every 4 (four) hours as needed for moderate pain (pain score 4-6) or severe pain (pain score 7-10). 60 tablet 0   lidocaine  (LIDODERM ) 5 % Place 1 patch onto the skin daily. Remove & Discard patch within 12 hours or as directed by MD (Patient not taking: Reported on 12/01/2024) 30 patch 0   Multiple Vitamins-Minerals (MULTIVITAMIN ADULTS 50+ PO) Take 1 tablet by mouth at bedtime. (Patient not taking: Reported on 12/01/2024)     Multiple Vitamins-Minerals (PRESERVISION AREDS 2) CAPS Take 1 capsule by mouth at bedtime. (Patient not taking: Reported on 12/01/2024)     No current facility-administered medications for this visit.    OBJECTIVE: Vitals:   12/01/24 1355  BP: (!) 143/79  Pulse: 88  Resp: 18  Temp: 98.2 F (36.8 C)  SpO2: 97%     Body mass index is 22.92 kg/m.    ECOG FS:1 - Symptomatic but completely ambulatory  General: Well-developed, well-nourished, no acute distress. Eyes: Pink conjunctiva, anicteric sclera. HEENT: Normocephalic, moist mucous membranes. Lungs: No audible wheezing or coughing. Heart: Regular rate and rhythm. Abdomen: Soft, nontender, no obvious distention. Musculoskeletal: No edema, cyanosis, or clubbing.  Back brace in place. Neuro: Alert, answering all questions appropriately. Cranial nerves grossly intact. Skin: No rashes or petechiae noted. Psych: Normal affect. Lymphatics: No cervical, calvicular, axillary or inguinal LAD.   LAB RESULTS:  Lab Results  Component Value Date   NA 137 11/28/2024   K 4.6 11/28/2024   CL 98 11/28/2024   CO2 30 11/28/2024   GLUCOSE 91 11/28/2024   BUN 13 11/28/2024   CREATININE 0.73 11/28/2024    CALCIUM 9.4 11/28/2024   PROT 7.1 11/25/2024   ALBUMIN 3.9 11/25/2024   AST 28 11/25/2024   ALT 8 11/25/2024   ALKPHOS 108 11/25/2024   BILITOT 0.3 11/25/2024   GFRNONAA >60 11/28/2024    Lab Results  Component Value Date   WBC 3.1 (L) 11/28/2024   NEUTROABS 1.5 (L) 11/28/2024   HGB 9.2 (L) 11/28/2024   HCT 28.7 (L) 11/28/2024   MCV 88.9 11/28/2024   PLT 185 11/28/2024     STUDIES: IR BONE MARROW BIOPSY & ASPIRATION Result Date: 11/28/2024 INDICATION: Multiple myeloma evaluation EXAM: FLUOROSCOPICALLY GUIDED RIGHT ILIAC BONE MARROW ASPIRATION AND CORE BIOPSY Date:  11/28/2024 11/28/2024 8:57 am Radiologist:  CHRISTELLA. Frederic Specking, MD Guidance:  FLUOROSCOPIC FLUOROSCOPY: Fluoroscopy Time: 1 minutes 0 seconds (8.0 mGy). MEDICATIONS: 1% lidocaine  local ANESTHESIA/SEDATION: 0.5 mg IV Versed ; 25 mcg IV Fentanyl  Moderate Sedation Time:  10 minute The patient was continuously monitored during the procedure by the interventional radiology  nurse under my direct supervision. CONTRAST:  None. COMPLICATIONS: None PROCEDURE: Informed consent was obtained from the patient following explanation of the procedure, risks, benefits and alternatives. The patient understands, agrees and consents for the procedure. All questions were addressed. A time out was performed. The patient was positioned prone and fluoroscopic localization was performed of the pelvis to demonstrate the iliac marrow spaces. Maximal barrier sterile technique utilized including caps, mask, sterile gowns, sterile gloves, large sterile drape, hand hygiene, and Betadine prep. Under sterile conditions and local anesthesia, an 11 gauge coaxial bone biopsy needle was advanced into the right iliac marrow space. Needle position was confirmed with fluoroscopic imaging. Initially, bone marrow aspiration was performed. Next, the 11 gauge outer cannula was utilized to obtain a right iliac bone marrow core biopsy. Needle was removed. Hemostasis was obtained with  compression. The patient tolerated the procedure well. Samples were prepared with the cytotechnologist. No immediate complications. IMPRESSION: Fluoroscopically guided right iliac bone marrow aspiration and core biopsy. Electronically Signed   By: CHRISTELLA.  Shick M.D.   On: 11/28/2024 10:09   CT Chest W Contrast Result Date: 11/24/2024 EXAM: CT CHEST WITH CONTRAST 11/24/2024 07:40:35 AM TECHNIQUE: CT of the chest was performed with the administration of 75 mL of iohexol  (OMNIPAQUE ) 300 MG/ML solution. Multiplanar reformatted images are provided for review. Automated exposure control, iterative reconstruction, and/or weight based adjustment of the mA/kV was utilized to reduce the radiation dose to as low as reasonably achievable. COMPARISON: CT abdomen and pelvis, thoracic and lumbar spine MRI reported separately today. CLINICAL HISTORY: 83 year old female with malignancy, unknown primary, and pathologic spine fractures. FINDINGS: MEDIASTINUM: Calcified coronary artery and aortic atherosclerosis. Heart size within normal limits. No pericardial effusion. The central airways are clear. Central pulmonary arteries appear to be normally enhancing. Moderate sized gastric hiatal hernia. Elevated right hemidiaphragm. LYMPH NODES: No mediastinal, hilar or axillary lymphadenopathy. LUNGS AND PLEURA: Evidence of centrilobular emphysema, mild in the upper lobes. Bilateral lung base mild bronchiectasis, chronic atelectasis and scarring in part related to chronic hiatal hernia and chronic elevation of the right hemidiaphragm. No active lung inflammation. No pleural effusion or pneumothorax. SOFT TISSUES/BONES: Diffusely abnormal bone mineralization throughout the thoracic spine with numerous thoracic compression fractures, detailed on MRI separately today. There is a mild degree of superimposed thoracic spinal hyperostosis with occasional interbody ankylosis (T3-T4, T9-T10). Heterogeneous bilateral ribs. No acute or pathologic rib  fracture is identified. Streak artifact from left shoulder arthroplasty. UPPER ABDOMEN: Visible upper abdominal viscera are stable from CT abdomen and pelvis reported separately today. IMPRESSION: 1. Abnormal thoracic spine as detailed on MRI separately today. Heterogeneous ribs, but no definite acute or pathologic rib fracture identified. 2. No superimposed acute or malignant process identified in the lungs or mediastinum. 3. Lung scarring and atelectasis with a mild degree of underlying Emphysema. Aortic and coronary artery atherosclerosis. Moderate gastric hiatal hernia and chronic elevated right hemidiaphragm. Electronically signed by: Helayne Hurst MD 11/24/2024 09:09 AM EST RP Workstation: [id number]   MR Lumbar Spine W Wo Contrast Result Date: 11/24/2024 EXAM: MRI LUMBAR SPINE 11/24/2024 07:11:00 AM TECHNIQUE: Multiplanar multisequence MRI of the lumbar spine was performed without and with the administration of intravenous contrast. 6 mL (gadobutrol  (GADAVIST ) 1 MMOL/ML injection 6 mL GADOBUTROL  1 MMOL/ML IV SOLN). COMPARISON: Thoracic MRI 11/24/2024 reported separately. CT abdomen and pelvis 11/24/2024 reported separately. CLINICAL HISTORY: 83 year old female with new widespread spinal compression fractures and diffuse heterogeneous skeletal mineralization on CT this morning. FINDINGS: Normal lumbar segmentation,  concordant with thoracic numbering today. BONES AND ALIGNMENT: Diffusely heterogeneous T1 marrow signal throughout the visible spine. Heterogeneous marrow in the sacrum and pelvis although less affected (series 13 image 39). Lower thoracic spinal compression fractures redemonstrated. Lumbar spinal compression fractures range from the L1 level (74% loss of vertebral body height) to the L2 level (4% loss of vertebral body height). All levels including the central sacrum demonstrate diffuse increased STIR hyperintensity and heterogeneous hyperenhancement following contrast (series 14 image 6). Sacrum  appears grossly intact. Retropulsion of the L1 posterior inferior endplate results in mild spinal stenosis seen on series 12 image 12. No extraosseous extension of tumor or soft tissue is identified in the lumbar spine. SPINAL CORD: Normal conus medullaris at L1. No signal abnormality in the visible lower thoracic spinal cord or conus. No abnormal intradural enhancement. No dural thickening. Normal cauda equina nerve roots. SOFT TISSUES: No paraspinal mass. Stable visible abdominal viscera. DEGENERATIVE: Pathologic compression fracture related mild spinal stenosis at the L1 level. Otherwise ordinary lumbar spine degeneration including disc bulging and endplate spurring. No high grade lumbar spine stenosis. There is moderate lumbar neural foraminal stenosis at the right L1 and L4 nerve levels. IMPRESSION: 1. Diffuse abnormal marrow signal throughout the spine, as seen on Thoracic MRI. Sacrum and pelvis are less affected. Consider metastatic disease, lymphoproliferative disorder, or less likely multiple myeloma (given heterogeneous sclerosis by CT). 2. Lumbar compression fractures with loss of height ranging from 74% (L1 ) to 4% (L2). Mild spinal and moderate right neural foraminal stenosis at L1 due to retropulsion. No extraosseous tumor or soft tissue. 3. Negative conus medullaris and cauda equina. Electronically signed by: Helayne Hurst MD 11/24/2024 07:33 AM EST RP Workstation: [id number]   MR THORACIC SPINE W WO CONTRAST Result Date: 11/24/2024 EXAM: MRI THORACIC SPINE WITH AND WITHOUT INTRAVENOUS CONTRAST 11/24/2024 07:11:00 AM TECHNIQUE: Multiplanar multisequence MRI of the thoracic spine was performed with and without the administration of intravenous contrast. COMPARISON: CT abdomen and pelvis 11/24/2024. CLINICAL HISTORY: 83 year old female with diffusely abnormal skeletal bone mineralization and diffuse thoracic and lumbar compression fractures, new from May. FINDINGS: Limited sagittal imaging of the  cervical spine demonstrates diffuse abnormality T1 hypointense marrow replacement, only partially sparing the anterior C2 vertebra (series 7 image 3). Thoracic segmentation appears to be normal. BONES AND ALIGNMENT: Heterogeneous diffusely abnormal T1 marrow signal throughout the thoracic vertebrae. Diffuse increased STIR signal within the thoracic vertebrae, including the levels T1 through T4 which appear to remain intact. From T5 through T12 there are thoracic compression fractures with loss of height ranging from T5 level (6% loss of vertebral body height) to the T7 level (48% loss of vertebral body height). No significant thoracic vertebral retropulsion. Also on sagittal STIR images there is a discrete round T6 vertebral body lesion located posteriorly on the right with intense increased STIR signal (series 9 image 7) but is relatively hypoenhancing following contrast. Similar signal abnormality in the T6 lamina and spinous process there (series 9 image 9). Smaller T11 round 11 mm right posterior vertebral body lesion is heterogeneously enhancing on series 14 image 7. Similar rounded lower cervical vertebral lesions are partially visible such as at C7 on the left (series 9 image 10). Visible posterior ribs are also diffusely heterogeneous. There is no extraosseous tumor or soft tissue extension identified in the thoracic spine. SPINAL CORD: Normal thoracic spinal cord signal and morphology. No abnormal intradural enhancement. No dural thickening. SOFT TISSUES: Negative visible soft tissues at the thoracic inlet, mediastinum.  Stable visible upper abdominal viscera. DEGENERATIVE CHANGES: Thoracic spinal canal is capacious. Mild for age thoracic spine disc degeneration including a small central disc protrusion at T2-T3 (series 10 image 14). No thoracic spinal stenosis. IMPRESSION: 1. Widespread thoracic pathologic compression fractures: T5 through T12 with loss of height ranging from 6% to 48%. No retropulsion,  extraosseous tumor, or other complicating features. 2. Underlying Diffuse abnormal spinal and rib marrow signal compatible with an infiltrative marrow process such as: metastatic disease, lymphoproliferative disorder, or less likely multiple myeloma (given heterogeneous sclerosis by CT). Multiple small, round and discrete STIR hyperintense vertebral lesions are noted, although not hyperenhancing. 3. No thoracic spinal stenosis. Normal thoracic spinal cord. Electronically signed by: Helayne Hurst MD 11/24/2024 07:24 AM EST RP Workstation: [id number]   CT ABDOMEN PELVIS W CONTRAST Result Date: 11/24/2024 EXAM: CT ABDOMEN AND PELVIS WITH CONTRAST 11/24/2024 04:55:30 AM TECHNIQUE: CT of the abdomen and pelvis was performed with the administration of 100 mL of iohexol  (OMNIPAQUE ) 300 MG/ML solution. Multiplanar reformatted images are provided for review. Automated exposure control, iterative reconstruction, and/or weight-based adjustment of the mA/kV was utilized to reduce the radiation dose to as low as reasonably achievable. COMPARISON: CT abdomen and pelvis 05/19/2024. CLINICAL HISTORY: 83 year old female. LLQ abdominal pain. FINDINGS: LOWER CHEST: No acute abnormality. HEART AND MEDIASTINUM: Advanced left coronary artery calcified atherosclerosis or stent on series 2 image 2. Stable heart size, upper limits of normal. No pericardial effusion. PLEURAL SPACES: No pleural effusion. LUNGS: Chronic elevation of the right hemidiaphragm. Chronic bilateral lung base atelectasis. LIVER: The liver is unremarkable. GALLBLADDER AND BILE DUCTS: Gallbladder is unremarkable. No biliary ductal dilatation. SPLEEN: No acute abnormality. PANCREAS: No acute abnormality. ADRENAL GLANDS: No acute abnormality. KIDNEYS, URETERS AND BLADDER: Renal parenchymal volume appears stable since May. Renal enhancement and contrast excretion appears symmetric and normal. No stones in the kidneys or ureters. No hydronephrosis. No perinephric or  periureteral stranding. Urinary bladder is unremarkable. GI AND BOWEL: Moderate chronic gastric hiatal hernia. Intraabdominal stomach and duodenum appear negative. Widespread diverticulosis of the distal descent and sigmoid colon with no active inflammation. Less pronounced large bowel diverticula elsewhere. Fluid in the ascending colon to the hepatic flexure. No dilated or inflamed bowel loops. There is no bowel obstruction. APPENDIX: Normal gas containing appendix on series 2 image 41. PERITONEUM AND RETROPERITONEUM: No ascites. No free air. VASCULATURE: Advanced calcified aortic atherosclerosis. Major arterial structures and portal venous system remain patent. ILIOFEMORAL ARTERIES: Less pronounced iliofemoral calcified atherosclerosis. LYMPH NODES: No lymphadenopathy. REPRODUCTIVE ORGANS: No acute abnormality. BONES AND SOFT TISSUES: Pronounced abnormal bone mineralization since May this year. At that time, L1 compression fracture was acute/subacute. There are now extensive lower thoracic and lumbar spinal compression fractures from the visible T7 through L4 levels, with associated bone mineralization heterogeneity, including heterogeneous sclerosis throughout the visible ribs and pelvis, which seem to remain intact. Retropulsion of bone at L1 results in mild spinal stenosis (sagittal image 53). No discrete destructive osseous lesion. Marked change in diffuse skeletal bone mineralization since May, and diffuse spinal compression fractures since that time. Constellation highly suspicious for a pathologic marrow infiltrative process. No discrete destructive bone lesion, but metastatic disease, myelofibrosis, or similar etiology. No paraspinal soft tissue mass. No focal soft tissue abnormality. IMPRESSION: 1. Marked change in the skeletal bone mineralization since May, with diffuse spinal compression fractures since that time. Constellation highly suspicious for a pathologic marrow infiltrative process. Although no  discrete destructive bone lesion, consider Metastatic disease, Myelofibrosis, or similar etiology.  2. Mild retropulsion of L1 resulting in mild spinal stenosis. 3. No superimposed acute or metastatic soft tissue abnormality identified in the abdomen or pelvis. Chronic aortic atherosclerosis, coronary artery atherosclerosis , moderate gastric hiatal hernia. Electronically signed by: Helayne Hurst MD 11/24/2024 05:20 AM EST RP Workstation: [id number]    ASSESSMENT: Recurrent ER/PR positive metastatic breast cancer.  PLAN:    Recurrent ER/PR positive metastatic breast cancer: Patient has a distant history of breast cancer undergoing mastectomy in 2015, but declined any chemotherapy, XRT, or aromatase inhibitor at the time.  Biopsy is pending at time of dictation, but myeloma workup is negative today and her CA 27-29 is elevated at 325.6.  Will get a PET scan to complete the staging workup.  Patient was given a prescription for letrozole today, and can consider adding Kisqali in the future if necessary.  Patient will return to clinic in 1 week to receive IV Zometa and then again in 5 weeks for further evaluation, assess her toleration of letrozole, and continuation of Zometa. Back pain: Patient was given a referral to radiation oncology.  Continue hydrocodone  as prescribed.  Patient also has follow-up with neurosurgery in the near future.  Appreciate palliative care input. Leukopenia: Mild.  Patient's total white blood cell count is 3.1.  Monitor. Anemia: Patient's hemoglobin has trended down to 9.2, monitor and consider full anemia workup in the future.  Patient expressed understanding and was in agreement with this plan. She also understands that She can call clinic at any time with any questions, concerns, or complaints.    Cancer Staging  Invasive carcinoma of breast (HCC) Staging form: Breast, AJCC 8th Edition - Clinical stage from 12/02/2024: Stage IV (cTX, cNX, pM1, ER+, PR+) - Signed by Jacobo Evalene PARAS, MD on 12/02/2024 Stage prefix: Initial diagnosis   Evalene PARAS Jacobo, MD   12/02/2024 1:47 PM        [1]  Social History Tobacco Use   Smoking status: Never   Smokeless tobacco: Never  Vaping Use   Vaping status: Never Used  Substance Use Topics   Alcohol use: Not Currently   Drug use: Never  [2] No Known Allergies

## 2024-12-03 ENCOUNTER — Telehealth: Admitting: Nurse Practitioner

## 2024-12-03 DIAGNOSIS — R309 Painful micturition, unspecified: Secondary | ICD-10-CM | POA: Diagnosis not present

## 2024-12-03 MED ORDER — CEPHALEXIN 500 MG PO CAPS: 500.0000 mg | ORAL_CAPSULE | Freq: Two times a day (BID) | ORAL | 0 refills | Status: AC

## 2024-12-03 NOTE — Progress Notes (Signed)

## 2024-12-06 ENCOUNTER — Encounter (HOSPITAL_COMMUNITY): Payer: Self-pay

## 2024-12-08 ENCOUNTER — Inpatient Hospital Stay

## 2024-12-08 VITALS — BP 138/63 | HR 64 | Temp 98.3°F | Resp 18

## 2024-12-08 DIAGNOSIS — C50919 Malignant neoplasm of unspecified site of unspecified female breast: Secondary | ICD-10-CM

## 2024-12-08 DIAGNOSIS — Z853 Personal history of malignant neoplasm of breast: Secondary | ICD-10-CM

## 2024-12-08 LAB — BASIC METABOLIC PANEL WITH GFR
Anion gap: 14 (ref 5–15)
BUN: 11 mg/dL (ref 8–23)
CO2: 24 mmol/L (ref 22–32)
Calcium: 9.5 mg/dL (ref 8.9–10.3)
Chloride: 101 mmol/L (ref 98–111)
Creatinine, Ser: 0.86 mg/dL (ref 0.44–1.00)
GFR, Estimated: >60 mL/min (ref >60)
Glucose, Bld: 104 mg/dL — ABNORMAL HIGH (ref 70–99)
Potassium: 3.4 mmol/L — ABNORMAL LOW (ref 3.5–5.1)
Sodium: 139 mmol/L (ref 135–145)

## 2024-12-08 LAB — CBC WITH DIFFERENTIAL (CANCER CENTER ONLY)
Abs Immature Granulocytes: 0.13 K/uL — ABNORMAL HIGH (ref 0.00–0.07)
Basophils Absolute: 0 K/uL (ref 0.0–0.1)
Basophils Relative: 1 %
Eosinophils Absolute: 0.1 K/uL (ref 0.0–0.5)
Eosinophils Relative: 3 %
HCT: 29.8 % — ABNORMAL LOW (ref 36.0–46.0)
Hemoglobin: 9.5 g/dL — ABNORMAL LOW (ref 12.0–15.0)
Immature Granulocytes: 4 %
Lymphocytes Relative: 34 %
Lymphs Abs: 1.2 K/uL (ref 0.7–4.0)
MCH: 28.2 pg (ref 26.0–34.0)
MCHC: 31.9 g/dL (ref 30.0–36.0)
MCV: 88.4 fL (ref 80.0–100.0)
Monocytes Absolute: 0.2 K/uL (ref 0.1–1.0)
Monocytes Relative: 7 %
Neutro Abs: 1.7 K/uL (ref 1.7–7.7)
Neutrophils Relative %: 51 %
Platelet Count: 182 K/uL (ref 150–400)
RBC: 3.37 MIL/uL — ABNORMAL LOW (ref 3.87–5.11)
RDW: 14.9 % (ref 11.5–15.5)
WBC Count: 3.4 K/uL — ABNORMAL LOW (ref 4.0–10.5)
nRBC: 2.7 % — ABNORMAL HIGH (ref 0.0–0.2)

## 2024-12-08 LAB — MAGNESIUM: Magnesium: 1.5 mg/dL — ABNORMAL LOW (ref 1.7–2.4)

## 2024-12-08 LAB — PHOSPHORUS: Phosphorus: 3.7 mg/dL (ref 2.5–4.6)

## 2024-12-08 MED ORDER — ZOLEDRONIC ACID 4 MG/5ML IV CONC
3.3000 mg | Freq: Once | INTRAVENOUS | Status: AC
  Administered 2024-12-08: 15:00:00 3.3 mg via INTRAVENOUS
  Filled 2024-12-08: qty 4.13

## 2024-12-08 MED ORDER — SODIUM CHLORIDE 0.9 % IV SOLN
INTRAVENOUS | Status: DC
  Filled 2024-12-08: qty 250

## 2024-12-08 MED ORDER — ZOLEDRONIC ACID 4 MG/100ML IV SOLN: 4.0000 mg | Freq: Once | INTRAVENOUS | Status: DC

## 2024-12-08 NOTE — Progress Notes (Signed)
 Per Dr. Jacobo, OK to proceed with zometa  without dental clearance.

## 2024-12-08 NOTE — Patient Instructions (Signed)

## 2024-12-16 ENCOUNTER — Ambulatory Visit
Admission: RE | Admit: 2024-12-16 | Discharge: 2024-12-16 | Disposition: A | Discharge status: Home / Self Care | Source: Ambulatory Visit | Attending: Oncology | Admitting: Oncology

## 2024-12-16 DIAGNOSIS — Z9011 Acquired absence of right breast and nipple: Secondary | ICD-10-CM | POA: Insufficient documentation

## 2024-12-16 DIAGNOSIS — I700 Atherosclerosis of aorta: Secondary | ICD-10-CM | POA: Diagnosis not present

## 2024-12-16 DIAGNOSIS — C772 Secondary and unspecified malignant neoplasm of intra-abdominal lymph nodes: Secondary | ICD-10-CM | POA: Diagnosis not present

## 2024-12-16 DIAGNOSIS — K449 Diaphragmatic hernia without obstruction or gangrene: Secondary | ICD-10-CM | POA: Diagnosis not present

## 2024-12-16 DIAGNOSIS — N200 Calculus of kidney: Secondary | ICD-10-CM | POA: Insufficient documentation

## 2024-12-16 DIAGNOSIS — M4856XA Collapsed vertebra, not elsewhere classified, lumbar region, initial encounter for fracture: Secondary | ICD-10-CM | POA: Diagnosis not present

## 2024-12-16 DIAGNOSIS — I2510 Atherosclerotic heart disease of native coronary artery without angina pectoris: Secondary | ICD-10-CM | POA: Insufficient documentation

## 2024-12-16 DIAGNOSIS — C7951 Secondary malignant neoplasm of bone: Secondary | ICD-10-CM | POA: Diagnosis not present

## 2024-12-16 DIAGNOSIS — Z853 Personal history of malignant neoplasm of breast: Secondary | ICD-10-CM | POA: Diagnosis present

## 2024-12-16 LAB — GLUCOSE, CAPILLARY: Glucose-Capillary: 99 mg/dL (ref 70–99)

## 2024-12-16 MED ORDER — FLUDEOXYGLUCOSE F - 18 (FDG) INJECTION
7.4000 | Freq: Once | INTRAVENOUS | Status: AC | PRN
  Administered 2024-12-16: 7.34 via INTRAVENOUS

## 2024-12-19 ENCOUNTER — Ambulatory Visit
Admission: RE | Admit: 2024-12-19 | Discharge: 2024-12-19 | Disposition: A | Discharge status: Home / Self Care | Source: Ambulatory Visit | Attending: Radiation Oncology | Admitting: Radiation Oncology

## 2024-12-19 ENCOUNTER — Other Ambulatory Visit: Payer: Self-pay | Admitting: *Deleted

## 2024-12-19 ENCOUNTER — Encounter: Payer: Self-pay | Admitting: Radiation Oncology

## 2024-12-19 VITALS — BP 119/70 | HR 84 | Wt 142.0 lb

## 2024-12-19 DIAGNOSIS — C50912 Malignant neoplasm of unspecified site of left female breast: Secondary | ICD-10-CM | POA: Insufficient documentation

## 2024-12-19 DIAGNOSIS — G4700 Insomnia, unspecified: Secondary | ICD-10-CM | POA: Insufficient documentation

## 2024-12-19 DIAGNOSIS — K219 Gastro-esophageal reflux disease without esophagitis: Secondary | ICD-10-CM | POA: Insufficient documentation

## 2024-12-19 DIAGNOSIS — Z79899 Other long term (current) drug therapy: Secondary | ICD-10-CM | POA: Insufficient documentation

## 2024-12-19 DIAGNOSIS — C7951 Secondary malignant neoplasm of bone: Secondary | ICD-10-CM | POA: Diagnosis present

## 2024-12-19 DIAGNOSIS — Z809 Family history of malignant neoplasm, unspecified: Secondary | ICD-10-CM | POA: Insufficient documentation

## 2024-12-19 DIAGNOSIS — Z170 Estrogen receptor positive status [ER+]: Secondary | ICD-10-CM | POA: Insufficient documentation

## 2024-12-19 DIAGNOSIS — Z800 Family history of malignant neoplasm of digestive organs: Secondary | ICD-10-CM | POA: Insufficient documentation

## 2024-12-19 DIAGNOSIS — Z8701 Personal history of pneumonia (recurrent): Secondary | ICD-10-CM | POA: Diagnosis not present

## 2024-12-19 DIAGNOSIS — C50919 Malignant neoplasm of unspecified site of unspecified female breast: Secondary | ICD-10-CM

## 2024-12-19 DIAGNOSIS — Z79811 Long term (current) use of aromatase inhibitors: Secondary | ICD-10-CM | POA: Insufficient documentation

## 2024-12-19 DIAGNOSIS — Z7983 Long term (current) use of bisphosphonates: Secondary | ICD-10-CM | POA: Diagnosis not present

## 2024-12-19 DIAGNOSIS — M1990 Unspecified osteoarthritis, unspecified site: Secondary | ICD-10-CM | POA: Diagnosis not present

## 2024-12-19 DIAGNOSIS — E785 Hyperlipidemia, unspecified: Secondary | ICD-10-CM | POA: Insufficient documentation

## 2024-12-19 MED ORDER — HYDROCODONE-ACETAMINOPHEN 5-325 MG PO TABS: 1.0000 | ORAL_TABLET | ORAL | 0 refills | Status: DC | PRN

## 2024-12-19 NOTE — Consult Note (Signed)
 " NEW PATIENT EVALUATION  Name: Samantha Ford  MRN: [id number]  Date:   12/19/2024     DOB: 02-24-1941   This 83 y.o. 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  REFERRING PHYSICIAN: Diedra Lame, MD  CHIEF COMPLAINT:  Chief Complaint  Patient presents with   Bone Mets    DIAGNOSIS: The encounter diagnosis was Invasive carcinoma of breast (HCC).   PREVIOUS INVESTIGATIONS:  PET CT scan, MRI of lumbar and thoracic spine as well as CT scans reviewed Pathology reports reviewed Clinical notes reviewed  HPI: Patient is a an 83 year old female status post mastectomy back in 2015 although this declined chemotherapy or radiation therapy and.  She had a workup for myeloma which was negative.  Her MRI of her sacrum thoracic spine shows diffuse abnormal marrow signal throughout the spine considering metastatic disease lymphoproliferative disorder less likely myeloma.  She also has lumbar compression fractures of L1-L2 with moderate right neuroforaminal stenosis at L1 due to retropulsion.  PET scan has been performed and on my review since has not been formally read shows widespread involvement of the spine multiple levels including her sacrum and S5 vertebral body.  She has been started on Femara  by medical oncology.  She is now referred to radiation oncology for consistent consideration of palliative treatment.  PLANNED TREATMENT REGIMEN: Palliative radiation therapy to lower lumbar and sacral region for metastatic disease  PAST MEDICAL HISTORY:  has a past medical history of Breast cancer (HCC) (10/22/2013), Cancer (HCC), GERD (gastroesophageal reflux disease), Hyperlipidemia, Insomnia, Macular degeneration of right eye, Osteoarthritis, Pneumonia, and Pneumothorax.    PAST SURGICAL HISTORY:  Past Surgical History:  Procedure Laterality Date   ANKLE FRACTURE SURGERY Right 09/30/2010    BREAST BIOPSY Left 11/29/2015   neg, ribbon clip   BREAST BIOPSY Left 11/29/2015   neg, coil clip    IR BONE MARROW BIOPSY & ASPIRATION  11/28/2024   KNEE ARTHROSCOPY Right 04/04/2008   MASTECTOMY Right 12/29/2013   PARTIAL MASTECTOMY WITH AXILLARY SENTINEL LYMPH NODE BIOPSY Right 12/06/2013   REVERSE SHOULDER ARTHROPLASTY Left 08/06/2023   Procedure: REVERSE SHOULDER ARTHROPLASTY WITH BICEPS TENODESIS.;  Surgeon: Edie Norleen PARAS, MD;  Location: [facility] ORS;  Service: Orthopedics;  Laterality: Left;    FAMILY HISTORY: family history includes Cancer in her brother; Colon cancer in her sister.  SOCIAL HISTORY:  reports that she has never smoked. She has never used smokeless tobacco. She reports that she does not currently use alcohol. She reports that she does not use drugs.  ALLERGIES: Patient has no known allergies.  MEDICATIONS:  Current Outpatient Medications  Medication Sig Dispense Refill   cetirizine (ZYRTEC) 10 MG tablet Take 10 mg by mouth daily.     Cholecalciferol (VITAMIN D3) 50 MCG CAPS Take 1 capsule by mouth at bedtime.     diphenhydrAMINE (BENADRYL) 25 MG tablet Take 25 mg by mouth at bedtime.     ibandronate (BONIVA) 150 MG tablet Take 150 mg by mouth every 30 (thirty) days.     ibuprofen (ADVIL) 200 MG tablet Take 2 tablets (400 mg total) by mouth every 8 (eight) hours as needed.     letrozole  (FEMARA ) 2.5 MG tablet Take 1 tablet (2.5 mg total) by mouth daily. 90 tablet 3   Magnesium  250 MG TABS Take 1 tablet by mouth at bedtime.     Omega-3 Fatty Acids (OMEGA-3 FISH OIL) 1200 MG CAPS Take 1 capsule  by mouth at bedtime.     omeprazole (PRILOSEC) 40 MG capsule Take 40 mg by mouth daily.     pravastatin (PRAVACHOL) 20 MG tablet Take 20 mg by mouth at bedtime.     tiZANidine (ZANAFLEX) 2 MG tablet Take 2 mg by mouth every 8 (eight) hours as needed.     traZODone  (DESYREL ) 50 MG tablet Take 50 mg by mouth at bedtime.     acetaminophen  (TYLENOL ) 325 MG tablet Take 2 tablets (650  mg total) by mouth every 8 (eight) hours as needed for mild pain (pain score 1-3), fever or headache. (Patient not taking: Reported on 12/19/2024)     HYDROcodone -acetaminophen  (NORCO/VICODIN) 5-325 MG tablet Take 1 tablet by mouth every 4 (four) hours as needed for moderate pain (pain score 4-6) or severe pain (pain score 7-10). 60 tablet 0   lidocaine  (LIDODERM ) 5 % Place 1 patch onto the skin daily. Remove & Discard patch within 12 hours or as directed by MD (Patient not taking: Reported on 12/19/2024) 30 patch 0   Multiple Vitamins-Minerals (MULTIVITAMIN ADULTS 50+ PO) Take 1 tablet by mouth at bedtime. (Patient not taking: Reported on 12/19/2024)     Multiple Vitamins-Minerals (PRESERVISION AREDS 2) CAPS Take 1 capsule by mouth at bedtime. (Patient not taking: Reported on 12/19/2024)     No current facility-administered medications for this encounter.    ECOG PERFORMANCE STATUS:  2 - Symptomatic, <50% confined to bed  REVIEW OF SYSTEMS: Patient denies any weight loss, fatigue, weakness, fever, chills or night sweats. Patient denies any loss of vision, blurred vision. Patient denies any ringing  of the ears or hearing loss. No irregular heartbeat. Patient denies heart murmur or history of fainting. Patient denies any chest pain or pain radiating to her upper extremities. Patient denies any shortness of breath, difficulty breathing at night, cough or hemoptysis. Patient denies any swelling in the lower legs. Patient denies any nausea vomiting, vomiting of blood, or coffee ground material in the vomitus. Patient denies any stomach pain. Patient states has had normal bowel movements no significant constipation or diarrhea. Patient denies any dysuria, hematuria or significant nocturia. Patient denies any problems walking, swelling in the joints or loss of balance. Patient denies any skin changes, loss of hair or loss of weight. Patient denies any excessive worrying or anxiety or significant depression.  Patient denies any problems with insomnia. Patient denies excessive thirst, polyuria, polydipsia. Patient denies any swollen glands, patient denies easy bruising or easy bleeding. Patient denies any recent infections, allergies or URI. Patient s visual fields have not changed significantly in recent time.   PHYSICAL EXAM: BP 119/70   Pulse 84   Wt 142 lb (64.4 kg) Comment: Stated wt  BMI 22.92 kg/m  Range of motion of her lower extremities does not elicit pain.  Motor and sensory levels are equal and symmetric in the lower extremities.  Well-developed well-nourished patient in NAD. HEENT reveals PERLA, EOMI, discs not visualized.  Oral cavity is clear. No oral mucosal lesions are identified. Neck is clear without evidence of cervical or supraclavicular adenopathy. Lungs are clear to A&P. Cardiac examination is essentially unremarkable with regular rate and rhythm without murmur rub or thrill. Abdomen is benign with no organomegaly or masses noted. Motor sensory and DTR levels are equal and symmetric in the upper and lower extremities. Cranial nerves II through XII are grossly intact. Proprioception is intact. No peripheral adenopathy or edema is identified. No motor or sensory levels are noted. Crude visual  fields are within normal range.  LABORATORY DATA: Pathology reports reviewed    RADIOLOGY RESULTS: MRI scan CT scans and PET scan all reviewed compatible with above-stated findings   IMPRESSION: Most likely metastatic breast cancer with increasing pain in her lower back and PET scan findings of metastatic disease in the sacrum and lower lumbar spine in 83 year old female  PLAN: At this time elected ahead with palliative radiation therapy to her sacrum and as well as her lower lumbar spine.  Would plan on delivering 30 Gray in 10 fractions.  Risks and benefits of treatment including possible diarrhea fatigue alteration blood counts skin reaction all were reviewed in detail with the patient.  She  seems to comprehend my treatment plan well.  I would like to take this opportunity to thank you for allowing me to participate in the care of your patient.SABRA Marcey Penton, MD         "

## 2024-12-20 NOTE — Progress Notes (Signed)
 I had a quick follow-up phone call with the patient and her caretaker.  They had a single question that they wanted follow-up on.  This was regarding her bracing.  We did discuss that her bracing could be removed while she was recumbent and only truly needed to be utilized while she was upright or out of bed.  This break from her bracing will help her skin maintain better health and we will give her improved comfort during rest.  We spent a total of 10 minutes on the phone.  They were at home and I was in in the office.

## 2024-12-26 ENCOUNTER — Other Ambulatory Visit: Payer: Self-pay

## 2024-12-26 ENCOUNTER — Ambulatory Visit
Admission: RE | Admit: 2024-12-26 | Discharge: 2024-12-26 | Disposition: A | Discharge status: Home / Self Care | Source: Ambulatory Visit | Attending: Radiation Oncology | Admitting: Radiation Oncology

## 2024-12-26 DIAGNOSIS — Z170 Estrogen receptor positive status [ER+]: Secondary | ICD-10-CM | POA: Insufficient documentation

## 2024-12-26 DIAGNOSIS — C7951 Secondary malignant neoplasm of bone: Secondary | ICD-10-CM | POA: Insufficient documentation

## 2024-12-26 DIAGNOSIS — Z1721 Progesterone receptor positive status: Secondary | ICD-10-CM | POA: Insufficient documentation

## 2024-12-26 DIAGNOSIS — Z510 Encounter for antineoplastic radiation therapy: Secondary | ICD-10-CM | POA: Insufficient documentation

## 2024-12-26 DIAGNOSIS — C50919 Malignant neoplasm of unspecified site of unspecified female breast: Secondary | ICD-10-CM | POA: Insufficient documentation

## 2024-12-26 MED ORDER — ONDANSETRON HCL 8 MG PO TABS: 8.0000 mg | ORAL_TABLET | Freq: Three times a day (TID) | ORAL | 3 refills | Status: AC | PRN

## 2024-12-29 ENCOUNTER — Ambulatory Visit
Admission: RE | Admit: 2024-12-29 | Discharge: 2024-12-29 | Disposition: A | Discharge status: Home / Self Care | Source: Ambulatory Visit | Attending: Radiation Oncology | Admitting: Radiation Oncology

## 2025-01-02 ENCOUNTER — Other Ambulatory Visit: Payer: Self-pay

## 2025-01-02 ENCOUNTER — Ambulatory Visit
Admission: RE | Admit: 2025-01-02 | Discharge: 2025-01-02 | Disposition: A | Source: Ambulatory Visit | Attending: Radiation Oncology | Admitting: Radiation Oncology

## 2025-01-02 ENCOUNTER — Other Ambulatory Visit: Payer: Self-pay | Admitting: *Deleted

## 2025-01-02 LAB — RAD ONC ARIA SESSION SUMMARY
Course Elapsed Days: 0
Plan Fractions Treated to Date: 1
Plan Prescribed Dose Per Fraction: 3 Gy
Plan Total Fractions Prescribed: 10
Plan Total Prescribed Dose: 30 Gy
Reference Point Dosage Given to Date: 3 Gy
Reference Point Session Dosage Given: 3 Gy
Session Number: 1

## 2025-01-02 MED ORDER — ONDANSETRON 4 MG PO TBDP: 4.0000 mg | ORAL_TABLET | Freq: Every day | ORAL | 0 refills | Status: AC

## 2025-01-02 MED ORDER — TIZANIDINE HCL 2 MG PO TABS: 2.0000 mg | ORAL_TABLET | Freq: Three times a day (TID) | ORAL | 1 refills | Status: AC | PRN

## 2025-01-03 ENCOUNTER — Ambulatory Visit
Admission: RE | Admit: 2025-01-03 | Discharge: 2025-01-03 | Disposition: A | Source: Ambulatory Visit | Attending: Radiation Oncology | Admitting: Radiation Oncology

## 2025-01-03 ENCOUNTER — Other Ambulatory Visit: Payer: Self-pay

## 2025-01-03 LAB — RAD ONC ARIA SESSION SUMMARY
Course Elapsed Days: 1
Plan Fractions Treated to Date: 2
Plan Prescribed Dose Per Fraction: 3 Gy
Plan Total Fractions Prescribed: 10
Plan Total Prescribed Dose: 30 Gy
Reference Point Dosage Given to Date: 6 Gy
Reference Point Session Dosage Given: 3 Gy
Session Number: 2

## 2025-01-04 ENCOUNTER — Other Ambulatory Visit: Payer: Self-pay | Admitting: *Deleted

## 2025-01-04 ENCOUNTER — Other Ambulatory Visit: Payer: Self-pay

## 2025-01-04 ENCOUNTER — Ambulatory Visit
Admission: RE | Admit: 2025-01-04 | Discharge: 2025-01-04 | Disposition: A | Discharge status: Home / Self Care | Source: Ambulatory Visit | Attending: Radiation Oncology | Admitting: Radiation Oncology

## 2025-01-04 DIAGNOSIS — C50919 Malignant neoplasm of unspecified site of unspecified female breast: Secondary | ICD-10-CM

## 2025-01-04 LAB — RAD ONC ARIA SESSION SUMMARY
Course Elapsed Days: 2
Plan Fractions Treated to Date: 3
Plan Prescribed Dose Per Fraction: 3 Gy
Plan Total Fractions Prescribed: 10
Plan Total Prescribed Dose: 30 Gy
Reference Point Dosage Given to Date: 9 Gy
Reference Point Session Dosage Given: 3 Gy
Session Number: 3

## 2025-01-05 ENCOUNTER — Inpatient Hospital Stay

## 2025-01-05 ENCOUNTER — Encounter: Payer: Self-pay | Admitting: Oncology

## 2025-01-05 ENCOUNTER — Other Ambulatory Visit: Payer: Self-pay

## 2025-01-05 ENCOUNTER — Inpatient Hospital Stay: Admitting: Hospice and Palliative Medicine

## 2025-01-05 ENCOUNTER — Inpatient Hospital Stay: Admitting: Oncology

## 2025-01-05 ENCOUNTER — Ambulatory Visit
Admission: RE | Admit: 2025-01-05 | Discharge: 2025-01-05 | Disposition: A | Source: Ambulatory Visit | Attending: Radiation Oncology | Admitting: Radiation Oncology

## 2025-01-05 VITALS — BP 114/63 | HR 77 | Temp 97.3°F

## 2025-01-05 VITALS — BP 124/69 | HR 84 | Temp 98.1°F | Ht 66.0 in | Wt 143.0 lb

## 2025-01-05 DIAGNOSIS — Z170 Estrogen receptor positive status [ER+]: Secondary | ICD-10-CM | POA: Insufficient documentation

## 2025-01-05 DIAGNOSIS — Z79891 Long term (current) use of opiate analgesic: Secondary | ICD-10-CM | POA: Insufficient documentation

## 2025-01-05 DIAGNOSIS — Z9011 Acquired absence of right breast and nipple: Secondary | ICD-10-CM | POA: Insufficient documentation

## 2025-01-05 DIAGNOSIS — Z1721 Progesterone receptor positive status: Secondary | ICD-10-CM | POA: Insufficient documentation

## 2025-01-05 DIAGNOSIS — D72819 Decreased white blood cell count, unspecified: Secondary | ICD-10-CM | POA: Insufficient documentation

## 2025-01-05 DIAGNOSIS — C7951 Secondary malignant neoplasm of bone: Secondary | ICD-10-CM | POA: Insufficient documentation

## 2025-01-05 DIAGNOSIS — D649 Anemia, unspecified: Secondary | ICD-10-CM | POA: Insufficient documentation

## 2025-01-05 DIAGNOSIS — Z515 Encounter for palliative care: Secondary | ICD-10-CM | POA: Insufficient documentation

## 2025-01-05 DIAGNOSIS — G893 Neoplasm related pain (acute) (chronic): Secondary | ICD-10-CM

## 2025-01-05 DIAGNOSIS — Z853 Personal history of malignant neoplasm of breast: Secondary | ICD-10-CM

## 2025-01-05 DIAGNOSIS — Z79811 Long term (current) use of aromatase inhibitors: Secondary | ICD-10-CM | POA: Insufficient documentation

## 2025-01-05 DIAGNOSIS — C50919 Malignant neoplasm of unspecified site of unspecified female breast: Secondary | ICD-10-CM

## 2025-01-05 DIAGNOSIS — M4850XD Collapsed vertebra, not elsewhere classified, site unspecified, subsequent encounter for fracture with routine healing: Secondary | ICD-10-CM | POA: Insufficient documentation

## 2025-01-05 DIAGNOSIS — M549 Dorsalgia, unspecified: Secondary | ICD-10-CM | POA: Insufficient documentation

## 2025-01-05 LAB — RAD ONC ARIA SESSION SUMMARY
Course Elapsed Days: 3
Plan Fractions Treated to Date: 4
Plan Prescribed Dose Per Fraction: 3 Gy
Plan Total Fractions Prescribed: 10
Plan Total Prescribed Dose: 30 Gy
Reference Point Dosage Given to Date: 12 Gy
Reference Point Session Dosage Given: 3 Gy
Session Number: 4

## 2025-01-05 LAB — MAGNESIUM: Magnesium: 1.9 mg/dL (ref 1.7–2.4)

## 2025-01-05 LAB — CBC WITH DIFFERENTIAL (CANCER CENTER ONLY)
Abs Immature Granulocytes: 0.04 K/uL (ref 0.00–0.07)
Basophils Absolute: 0 K/uL (ref 0.0–0.1)
Basophils Relative: 1 %
Eosinophils Absolute: 0.1 K/uL (ref 0.0–0.5)
Eosinophils Relative: 3 %
HCT: 28 % — ABNORMAL LOW (ref 36.0–46.0)
Hemoglobin: 8.6 g/dL — ABNORMAL LOW (ref 12.0–15.0)
Immature Granulocytes: 2 %
Lymphocytes Relative: 32 %
Lymphs Abs: 0.8 K/uL (ref 0.7–4.0)
MCH: 28.1 pg (ref 26.0–34.0)
MCHC: 30.7 g/dL (ref 30.0–36.0)
MCV: 91.5 fL (ref 80.0–100.0)
Monocytes Absolute: 0.2 K/uL (ref 0.1–1.0)
Monocytes Relative: 7 %
Neutro Abs: 1.3 K/uL — ABNORMAL LOW (ref 1.7–7.7)
Neutrophils Relative %: 55 %
Platelet Count: 152 K/uL (ref 150–400)
RBC: 3.06 MIL/uL — ABNORMAL LOW (ref 3.87–5.11)
RDW: 16.1 % — ABNORMAL HIGH (ref 11.5–15.5)
WBC Count: 2.4 K/uL — ABNORMAL LOW (ref 4.0–10.5)
nRBC: 2.9 % — ABNORMAL HIGH (ref 0.0–0.2)

## 2025-01-05 LAB — BASIC METABOLIC PANEL WITH GFR
Anion gap: 8 (ref 5–15)
BUN: 12 mg/dL (ref 8–23)
CO2: 28 mmol/L (ref 22–32)
Calcium: 8.7 mg/dL — ABNORMAL LOW (ref 8.9–10.3)
Chloride: 102 mmol/L (ref 98–111)
Creatinine, Ser: 0.91 mg/dL (ref 0.44–1.00)
GFR, Estimated: >60 mL/min
Glucose, Bld: 109 mg/dL — ABNORMAL HIGH (ref 70–99)
Potassium: 4.3 mmol/L (ref 3.5–5.1)
Sodium: 137 mmol/L (ref 135–145)

## 2025-01-05 LAB — PHOSPHORUS: Phosphorus: 2.8 mg/dL (ref 2.5–4.6)

## 2025-01-05 MED ORDER — ZOLEDRONIC ACID 4 MG/5ML IV CONC
3.3000 mg | Freq: Once | INTRAVENOUS | Status: AC
  Administered 2025-01-05: 3.3 mg via INTRAVENOUS
  Filled 2025-01-05: qty 4.13

## 2025-01-05 MED ORDER — HYDROCODONE-ACETAMINOPHEN 5-325 MG PO TABS: 1.0000 | ORAL_TABLET | ORAL | 0 refills | Status: AC | PRN

## 2025-01-05 NOTE — Progress Notes (Signed)
 Ok per Dr jacobo to give zometa  3.3 mg based on calcium of 8.7

## 2025-01-05 NOTE — Progress Notes (Signed)
 "    Palliative Medicine [facility] at [facility] Telephone:([phone] Fax:([phone]) [phone]   Name: Samantha Ford Date: 01/05/2025 MRN: [id number]  DOB: 07/07/1941  Patient Care Team: Diedra Lame, MD as PCP - General (Family Medicine) Jacobo Evalene PARAS, MD as Consulting Physician (Oncology)    REASON FOR CONSULTATION: Samantha Ford is a 84 y.o. female with multiple medical problems including OA, remote history of breast cancer status postmastectomy in 2015, who presented to the emergency department with complaint of low back pain.  CT of the abdomen showed multiple diffuse spinal compression pathologic fractures.  Palliative care was consulted to address goals and manage ongoing symptoms.   SOCIAL HISTORY:     reports that she has never smoked. She has never used smokeless tobacco. She reports that she does not currently use alcohol. She reports that she does not use drugs.  Patient is widowed.  Lives at home with her grandson.  She has 2 sons and multiple grandchildren who live nearby.  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  ADVANCE DIRECTIVES:  Does not have  CODE STATUS:   PAST MEDICAL HISTORY: Past Medical History:  Diagnosis Date   Breast cancer (HCC) 10/22/2013   Mastectomy in 2015   Cancer [facility])    right breast 12/06/2013   GERD (gastroesophageal reflux disease)    Hyperlipidemia    Insomnia    Macular degeneration of right eye    Osteoarthritis    Pneumonia    Pneumothorax     PAST SURGICAL HISTORY:  Past Surgical History:  Procedure Laterality Date   ANKLE FRACTURE SURGERY Right 09/30/2010   BREAST BIOPSY Left 11/29/2015   neg, ribbon clip   BREAST BIOPSY Left 11/29/2015   neg, coil clip    IR BONE MARROW BIOPSY & ASPIRATION  11/28/2024   KNEE ARTHROSCOPY Right 04/04/2008   MASTECTOMY Right 12/29/2013   PARTIAL MASTECTOMY WITH AXILLARY SENTINEL LYMPH NODE BIOPSY Right 12/06/2013   REVERSE  SHOULDER ARTHROPLASTY Left 08/06/2023   Procedure: REVERSE SHOULDER ARTHROPLASTY WITH BICEPS TENODESIS.;  Surgeon: Edie Norleen PARAS, MD;  Location: [facility] ORS;  Service: Orthopedics;  Laterality: Left;    HEMATOLOGY/ONCOLOGY HISTORY:  Oncology History  Invasive carcinoma of breast (HCC)  12/02/2024 Initial Diagnosis   Invasive carcinoma of breast (HCC)   12/02/2024 Cancer Staging   Staging form: Breast, AJCC 8th Edition - Clinical stage from 12/02/2024: Stage IV (cTX, cNX, pM1, ER+, PR+) - Signed by Jacobo Evalene PARAS, MD on 12/02/2024 Stage prefix: Initial diagnosis     ALLERGIES:  has no known allergies.  MEDICATIONS:  Current Outpatient Medications  Medication Sig Dispense Refill   acetaminophen  (TYLENOL ) 325 MG tablet Take 2 tablets (650 mg total) by mouth every 8 (eight) hours as needed for mild pain (pain score 1-3), fever or headache.     Cholecalciferol (VITAMIN D3) 50 MCG CAPS Take 1 capsule by mouth at bedtime.     diphenhydrAMINE (BENADRYL) 25 MG tablet Take 25 mg by mouth at bedtime.     HYDROcodone -acetaminophen  (NORCO/VICODIN) 5-325 MG tablet Take 1 tablet by mouth every 4 (four) hours as needed for moderate pain (pain score 4-6) or severe pain (pain score 7-10). 60 tablet 0   ibandronate (BONIVA) 150 MG tablet Take 150 mg by mouth every 30 (thirty) days.     ibuprofen (ADVIL) 200 MG tablet Take 2 tablets (400 mg total) by mouth every 8 (eight) hours as needed.     letrozole  (  FEMARA ) 2.5 MG tablet Take 1 tablet (2.5 mg total) by mouth daily. 90 tablet 3   lidocaine  (LIDODERM ) 5 % Place 1 patch onto the skin daily. Remove & Discard patch within 12 hours or as directed by MD 30 patch 0   Magnesium  250 MG TABS Take 1 tablet by mouth at bedtime.     Omega-3 Fatty Acids (OMEGA-3 FISH OIL) 1200 MG CAPS Take 1 capsule by mouth at bedtime.     omeprazole (PRILOSEC) 40 MG capsule Take 40 mg by mouth daily.     ondansetron  (ZOFRAN ) 8 MG tablet Take 1 tablet (8 mg total) by mouth every  8 (eight) hours as needed for nausea or vomiting. 20 tablet 3   ondansetron  (ZOFRAN -ODT) 4 MG disintegrating tablet Take 1 tablet (4 mg total) by mouth daily at 6 (six) AM. 30 tablet 0   pravastatin (PRAVACHOL) 20 MG tablet Take 20 mg by mouth at bedtime.     tiZANidine  (ZANAFLEX ) 2 MG tablet Take 1 tablet (2 mg total) by mouth every 8 (eight) hours as needed. 30 tablet 1   traZODone  (DESYREL ) 50 MG tablet Take 50 mg by mouth at bedtime.     No current facility-administered medications for this visit.    VITAL SIGNS: There were no vitals taken for this visit. There were no vitals filed for this visit.  Estimated body mass index is 23.08 kg/m as calculated from the following:   Height as of an earlier encounter on 01/05/25: 5' 6 (1.676 m).   Weight as of an earlier encounter on 01/05/25: 143 lb (64.9 kg).  LABS: CBC:    Component Value Date/Time   WBC 2.4 (L) 01/05/2025 1425   WBC 3.1 (L) 11/28/2024 0529   HGB 8.6 (L) 01/05/2025 1425   HCT 28.0 (L) 01/05/2025 1425   PLT 152 01/05/2025 1425   MCV 91.5 01/05/2025 1425   NEUTROABS 1.3 (L) 01/05/2025 1425   LYMPHSABS 0.8 01/05/2025 1425   MONOABS 0.2 01/05/2025 1425   EOSABS 0.1 01/05/2025 1425   BASOSABS 0.0 01/05/2025 1425   Comprehensive Metabolic Panel:    Component Value Date/Time   NA 137 01/05/2025 1425   K 4.3 01/05/2025 1425   CL 102 01/05/2025 1425   CO2 28 01/05/2025 1425   BUN 12 01/05/2025 1425   CREATININE 0.91 01/05/2025 1425   GLUCOSE 109 (H) 01/05/2025 1425   CALCIUM 8.7 (L) 01/05/2025 1425   AST 28 11/25/2024 0536   ALT 8 11/25/2024 0536   ALKPHOS 108 11/25/2024 0536   BILITOT 0.3 11/25/2024 0536   PROT 7.1 11/25/2024 0536   ALBUMIN 3.9 11/25/2024 0536    RADIOGRAPHIC STUDIES: NM PET Image Initial (PI) Skull Base To Thigh Result Date: 12/26/2024 EXAM: PET AND CT SKULL BASE TO MID THIGH 12/16/2024 11:24:24 AM TECHNIQUE: RADIOPHARMACEUTICAL: 7.34 mCi F-18 FDG Uptake time 60 minutes. Glucose level 99  mg/dl. Blood pool suv 2.5. PET imaging was acquired from the base of the skull to the mid thighs. Non-contrast enhanced computed tomography was obtained for attenuation correction and anatomic localization. COMPARISON: Chest CT of 11/24/2024. Abdominal pelvic CT of 11/24/2024. Thoracolumbar spine MRI of 11/24/2024. CLINICAL HISTORY: Compression fracture, lumbar. Compression fracture, lumbar. Prior breast cancer. FINDINGS: HEAD AND NECK: Bilateral low jugular / supraclavicular hypermetabolic nodes. Example on the right at 7 mm with SUV 10.5 on image 34/6. bilateral carotid atherosclerosis. Prior lens extractions. CHEST: Right axillary node measures 5 mm with SUV 8.7 on image 47/6. Subcarinal node measures 11 mm with  SUV 14.9 on image 50/6. Anterior medial right pleural versus pericardial hypermetabolic nodule at 6 mm with SUV 7.4 on image 53/6. Cardiomegaly. Aortic and coronary artery calcifications. Tiny right pleural effusion is unchanged. Pulmonary artery enlargement, outflow tract 3.2 cm. ABDOMEN AND PELVIS: There is no hypermetabolic activity within the liver, spleen or pancreas. Abdominal retroperitoneal hypermetabolic nodes. Example pre-aortic node at 8 mm with SUV 5.9 on image 85/6. Normal adrenal glands. Mild renal cortical thinning bilaterally. Punctate interpolar left renal collecting system calculus. Colonic diverticulosis. Pelvic floor laxity. BONES AND SOFT TISSUE: Relatively diffuse osseous metastasis. Example lesion within T12 at SUV 11.5. Left shoulder arthroplasty. Right mastectomy. Hypermetabolic nodularity along the pectoralis musculature including at SUV 8.1 on image 60/6. IMPRESSION: 1. Status post right mastectomy. Relatively diffuse osseous metastasis, as on prior thoracolumbar spine MRI. 2. Metastatic disease within the right chest wall, low neck, chest, and abdominal nodal stations as detailed above. 3. Incidental findings, including: Moderate hiatal hernia. Aortic atherosclerosis  ([id number]). Coronary artery atherosclerosis. Pulmonary artery enlargement suggests pulmonary arterial hypertension. Left nephrolithiasis. Electronically signed by: Rockey Kilts MD 12/26/2024 04:24 PM EST RP Workstation: [id number]    PERFORMANCE STATUS (ECOG) : 1 - Symptomatic but completely ambulatory  Review of Systems Unless otherwise noted, a complete review of systems is negative.  Physical Exam General: NAD Pulmonary: unlabored Extremities: no edema, no joint deformities Skin: no rashes Neurological: Weakness but otherwise nonfocal  IMPRESSION: Follow-up.  Patient accompanied by son.  Patient reports she is doing well.  Denies significant changes or concerns.  Says that her pain overall is improving.  She is still taking Norco but now every 8 hours as needed.  Tolerating well.  She does request refill today.  PLAN: - Continue current scope of treatment - Norco 5-325mg  every 4 hours as needed #60 - PDMP reviewed - Daily bowel regimen -Follow-up telephone visit 2 months  Case and plan discussed with Dr. Jacobo   Patient expressed understanding and was in agreement with this plan. She also understands that She can call the clinic at any time with any questions, concerns, or complaints.     Time Total: 15 minutes  Visit consisted of counseling and education dealing with the complex and emotionally intense issues of symptom management and palliative care in the setting of serious and potentially life-threatening illness.Greater than 50%  of this time was spent counseling and coordinating care related to the above assessment and plan.  Signed by: Fonda Mower, PhD, NP-C   "

## 2025-01-05 NOTE — Patient Instructions (Signed)

## 2025-01-05 NOTE — Progress Notes (Signed)
 " [facility]  [phone] [phone]  ID: Samantha Ford OB: 14-Aug-1941  MR#: [id number]  CSN#:[id number]  Patient Care Team: Diedra Lame, MD as PCP - General (Family Medicine) Jacobo Evalene PARAS, MD as Consulting Physician (Oncology)  CHIEF COMPLAINT: Recurrent ER/PR positive metastatic breast cancer.  INTERVAL HISTORY: Patient returns to clinic today for further evaluation and continuation of Zometa .  Her pain continues to improve although she continues to require back brace.  She also continues daily XRT. She has no neurologic complaints.  She denies any recent fevers or illnesses.  She has a good appetite and denies weight loss.  She has no chest pain, shortness of breath, cough, or hemoptysis.  She denies any nausea, vomiting, constipation, or diarrhea.  She has no urinary complaints.  Patient offers no further specific complaints today.  REVIEW OF SYSTEMS:   Review of Systems  Constitutional: Negative.  Negative for fever, malaise/fatigue and weight loss.  Respiratory: Negative.  Negative for cough, hemoptysis and shortness of breath.   Cardiovascular: Negative.  Negative for chest pain and leg swelling.  Gastrointestinal: Negative.  Negative for abdominal pain.  Genitourinary: Negative.  Negative for dysuria.  Musculoskeletal:  Positive for back pain.  Skin: Negative.  Negative for rash.  Neurological: Negative.  Negative for dizziness, focal weakness, weakness and headaches.  Psychiatric/Behavioral: Negative.  The patient is not nervous/anxious.     As per HPI. Otherwise, a complete review of systems is negative.  PAST MEDICAL HISTORY: Past Medical History:  Diagnosis Date   Breast cancer (HCC) 10/22/2013   Mastectomy in 2015   Cancer [facility])    right breast 12/06/2013   GERD (gastroesophageal reflux disease)    Hyperlipidemia    Insomnia    Macular degeneration of right eye    Osteoarthritis    Pneumonia    Pneumothorax      PAST SURGICAL HISTORY: Past Surgical History:  Procedure Laterality Date   ANKLE FRACTURE SURGERY Right 09/30/2010   BREAST BIOPSY Left 11/29/2015   neg, ribbon clip   BREAST BIOPSY Left 11/29/2015   neg, coil clip    IR BONE MARROW BIOPSY & ASPIRATION  11/28/2024   KNEE ARTHROSCOPY Right 04/04/2008   MASTECTOMY Right 12/29/2013   PARTIAL MASTECTOMY WITH AXILLARY SENTINEL LYMPH NODE BIOPSY Right 12/06/2013   REVERSE SHOULDER ARTHROPLASTY Left 08/06/2023   Procedure: REVERSE SHOULDER ARTHROPLASTY WITH BICEPS TENODESIS.;  Surgeon: Edie Norleen PARAS, MD;  Location: [facility] ORS;  Service: Orthopedics;  Laterality: Left;    FAMILY HISTORY: Family History  Problem Relation Age of Onset   Colon cancer Sister    Cancer Brother    Breast cancer Neg Hx     ADVANCED DIRECTIVES (Y/N):  N  HEALTH MAINTENANCE: Social History[1]   Colonoscopy:  PAP:  Bone density:  Lipid panel:  Allergies[2]  Current Outpatient Medications  Medication Sig Dispense Refill   acetaminophen  (TYLENOL ) 325 MG tablet Take 2 tablets (650 mg total) by mouth every 8 (eight) hours as needed for mild pain (pain score 1-3), fever or headache.     Cholecalciferol (VITAMIN D3) 50 MCG CAPS Take 1 capsule by mouth at bedtime.     diphenhydrAMINE (BENADRYL) 25 MG tablet Take 25 mg by mouth at bedtime.     ibandronate (BONIVA) 150 MG tablet Take 150 mg by mouth every 30 (thirty) days.     ibuprofen (ADVIL) 200 MG tablet Take 2 tablets (400 mg total) by mouth every 8 (eight) hours as needed.  letrozole  (FEMARA ) 2.5 MG tablet Take 1 tablet (2.5 mg total) by mouth daily. 90 tablet 3   lidocaine  (LIDODERM ) 5 % Place 1 patch onto the skin daily. Remove & Discard patch within 12 hours or as directed by MD 30 patch 0   Magnesium  250 MG TABS Take 1 tablet by mouth at bedtime.     Omega-3 Fatty Acids (OMEGA-3 FISH OIL) 1200 MG CAPS Take 1 capsule by mouth at bedtime.     omeprazole (PRILOSEC) 40 MG capsule Take 40 mg by mouth  daily.     ondansetron  (ZOFRAN ) 8 MG tablet Take 1 tablet (8 mg total) by mouth every 8 (eight) hours as needed for nausea or vomiting. 20 tablet 3   ondansetron  (ZOFRAN -ODT) 4 MG disintegrating tablet Take 1 tablet (4 mg total) by mouth daily at 6 (six) AM. 30 tablet 0   pravastatin (PRAVACHOL) 20 MG tablet Take 20 mg by mouth at bedtime.     tiZANidine  (ZANAFLEX ) 2 MG tablet Take 1 tablet (2 mg total) by mouth every 8 (eight) hours as needed. 30 tablet 1   traZODone  (DESYREL ) 50 MG tablet Take 50 mg by mouth at bedtime.     HYDROcodone -acetaminophen  (NORCO/VICODIN) 5-325 MG tablet Take 1 tablet by mouth every 4 (four) hours as needed for moderate pain (pain score 4-6) or severe pain (pain score 7-10). 60 tablet 0   No current facility-administered medications for this visit.    OBJECTIVE: Vitals:   01/05/25 1434  BP: 124/69  Pulse: 84  Temp: 98.1 F (36.7 C)  SpO2: 93%     Body mass index is 23.08 kg/m.    ECOG FS:1 - Symptomatic but completely ambulatory  General: Well-developed, well-nourished, no acute distress.  Sitting in a wheelchair with back brace in place. Eyes: Pink conjunctiva, anicteric sclera. HEENT: Normocephalic, moist mucous membranes. Lungs: No audible wheezing or coughing. Heart: Regular rate and rhythm. Abdomen: Soft, nontender, no obvious distention. Musculoskeletal: No edema, cyanosis, or clubbing. Neuro: Alert, answering all questions appropriately. Cranial nerves grossly intact. Skin: No rashes or petechiae noted. Psych: Normal affect.  LAB RESULTS:  Lab Results  Component Value Date   NA 137 01/05/2025   K 4.3 01/05/2025   CL 102 01/05/2025   CO2 28 01/05/2025   GLUCOSE 109 (H) 01/05/2025   BUN 12 01/05/2025   CREATININE 0.91 01/05/2025   CALCIUM 8.7 (L) 01/05/2025   PROT 7.1 11/25/2024   ALBUMIN 3.9 11/25/2024   AST 28 11/25/2024   ALT 8 11/25/2024   ALKPHOS 108 11/25/2024   BILITOT 0.3 11/25/2024   GFRNONAA >60 01/05/2025    Lab  Results  Component Value Date   WBC 2.4 (L) 01/05/2025   NEUTROABS 1.3 (L) 01/05/2025   HGB 8.6 (L) 01/05/2025   HCT 28.0 (L) 01/05/2025   MCV 91.5 01/05/2025   PLT 152 01/05/2025     STUDIES: NM PET Image Initial (PI) Skull Base To Thigh Result Date: 12/26/2024 EXAM: PET AND CT SKULL BASE TO MID THIGH 12/16/2024 11:24:24 AM TECHNIQUE: RADIOPHARMACEUTICAL: 7.34 mCi F-18 FDG Uptake time 60 minutes. Glucose level 99 mg/dl. Blood pool suv 2.5. PET imaging was acquired from the base of the skull to the mid thighs. Non-contrast enhanced computed tomography was obtained for attenuation correction and anatomic localization. COMPARISON: Chest CT of 11/24/2024. Abdominal pelvic CT of 11/24/2024. Thoracolumbar spine MRI of 11/24/2024. CLINICAL HISTORY: Compression fracture, lumbar. Compression fracture, lumbar. Prior breast cancer. FINDINGS: HEAD AND NECK: Bilateral low jugular / supraclavicular hypermetabolic nodes.  Example on the right at 7 mm with SUV 10.5 on image 34/6. bilateral carotid atherosclerosis. Prior lens extractions. CHEST: Right axillary node measures 5 mm with SUV 8.7 on image 47/6. Subcarinal node measures 11 mm with SUV 14.9 on image 50/6. Anterior medial right pleural versus pericardial hypermetabolic nodule at 6 mm with SUV 7.4 on image 53/6. Cardiomegaly. Aortic and coronary artery calcifications. Tiny right pleural effusion is unchanged. Pulmonary artery enlargement, outflow tract 3.2 cm. ABDOMEN AND PELVIS: There is no hypermetabolic activity within the liver, spleen or pancreas. Abdominal retroperitoneal hypermetabolic nodes. Example pre-aortic node at 8 mm with SUV 5.9 on image 85/6. Normal adrenal glands. Mild renal cortical thinning bilaterally. Punctate interpolar left renal collecting system calculus. Colonic diverticulosis. Pelvic floor laxity. BONES AND SOFT TISSUE: Relatively diffuse osseous metastasis. Example lesion within T12 at SUV 11.5. Left shoulder arthroplasty. Right  mastectomy. Hypermetabolic nodularity along the pectoralis musculature including at SUV 8.1 on image 60/6. IMPRESSION: 1. Status post right mastectomy. Relatively diffuse osseous metastasis, as on prior thoracolumbar spine MRI. 2. Metastatic disease within the right chest wall, low neck, chest, and abdominal nodal stations as detailed above. 3. Incidental findings, including: Moderate hiatal hernia. Aortic atherosclerosis (icd10-i70.0). Coronary artery atherosclerosis. Pulmonary artery enlargement suggests pulmonary arterial hypertension. Left nephrolithiasis. Electronically signed by: Rockey Kilts MD 12/26/2024 04:24 PM EST RP Workstation: [id number]    ASSESSMENT: Recurrent ER/PR positive metastatic breast cancer.  PLAN:    Recurrent ER/PR positive metastatic breast cancer: Patient has a distant history of breast cancer undergoing mastectomy in 2015, but declined any chemotherapy, XRT, or aromatase inhibitor at the time.  Patient underwent bone marrow biopsy on December 06, 2024 that revealed metastatic carcinoma consistent with patient's history of breast cancer representing approximately 30%.  PET scan results from December 16, 2024 revealed diffuse osseous metastasis as well as multiple nodal stations consistent with stage IV malignancy. CA 27-29 is elevated at 325.6, today's result is pending.  Continue letrozole .  Can consider adding Kisqali in the future if necessary.  Proceed with Zometa  today.  Continue daily XRT.  Return to clinic in 1 month for further evaluation and continuation of treatment. Back pain: Continue daily XRT.  Continue hydrocodone  as prescribed.  Continue follow-up with neurosurgery as scheduled.   Appreciate palliative care input. Leukopenia: Chronic and unchanged.  Patient's total white blood cell count is 2.4. Anemia: Patient's hemoglobin trend down slightly to 8.6, monitor.  Consider full anemia workup in the future. Hypocalcemia: Proceed with Zometa  as above.   Patient  expressed understanding and was in agreement with this plan. She also understands that She can call clinic at any time with any questions, concerns, or complaints.    Cancer Staging  Invasive carcinoma of breast (HCC) Staging form: Breast, AJCC 8th Edition - Clinical stage from 12/02/2024: Stage IV (cTX, cNX, pM1, ER+, PR+) - Signed by Jacobo Evalene PARAS, MD on 12/02/2024 Stage prefix: Initial diagnosis   Evalene PARAS Jacobo, MD   01/05/2025 4:09 PM          [1]  Social History Tobacco Use   Smoking status: Never   Smokeless tobacco: Never  Vaping Use   Vaping status: Never Used  Substance Use Topics   Alcohol use: Not Currently   Drug use: Never  [2] No Known Allergies  "

## 2025-01-06 ENCOUNTER — Ambulatory Visit
Admission: RE | Admit: 2025-01-06 | Discharge: 2025-01-06 | Disposition: A | Discharge status: Home / Self Care | Source: Ambulatory Visit | Attending: Radiation Oncology | Admitting: Radiation Oncology

## 2025-01-06 ENCOUNTER — Other Ambulatory Visit: Payer: Self-pay

## 2025-01-06 LAB — RAD ONC ARIA SESSION SUMMARY
Course Elapsed Days: 4
Plan Fractions Treated to Date: 5
Plan Prescribed Dose Per Fraction: 3 Gy
Plan Total Fractions Prescribed: 10
Plan Total Prescribed Dose: 30 Gy
Reference Point Dosage Given to Date: 15 Gy
Reference Point Session Dosage Given: 3 Gy
Session Number: 5

## 2025-01-09 ENCOUNTER — Other Ambulatory Visit: Payer: Self-pay

## 2025-01-09 ENCOUNTER — Ambulatory Visit
Admission: RE | Admit: 2025-01-09 | Discharge: 2025-01-09 | Disposition: A | Source: Ambulatory Visit | Attending: Radiation Oncology | Admitting: Radiation Oncology

## 2025-01-09 LAB — RAD ONC ARIA SESSION SUMMARY
Course Elapsed Days: 7
Plan Fractions Treated to Date: 6
Plan Prescribed Dose Per Fraction: 3 Gy
Plan Total Fractions Prescribed: 10
Plan Total Prescribed Dose: 30 Gy
Reference Point Dosage Given to Date: 18 Gy
Reference Point Session Dosage Given: 3 Gy
Session Number: 6

## 2025-01-10 ENCOUNTER — Ambulatory Visit
Admission: RE | Admit: 2025-01-10 | Discharge: 2025-01-10 | Disposition: A | Source: Ambulatory Visit | Attending: Radiation Oncology | Admitting: Radiation Oncology

## 2025-01-10 ENCOUNTER — Other Ambulatory Visit: Payer: Self-pay

## 2025-01-10 LAB — RAD ONC ARIA SESSION SUMMARY
Course Elapsed Days: 8
Plan Fractions Treated to Date: 7
Plan Prescribed Dose Per Fraction: 3 Gy
Plan Total Fractions Prescribed: 10
Plan Total Prescribed Dose: 30 Gy
Reference Point Dosage Given to Date: 21 Gy
Reference Point Session Dosage Given: 3 Gy
Session Number: 7

## 2025-01-11 ENCOUNTER — Other Ambulatory Visit: Payer: Self-pay | Admitting: *Deleted

## 2025-01-11 ENCOUNTER — Other Ambulatory Visit: Payer: Self-pay

## 2025-01-11 ENCOUNTER — Ambulatory Visit
Admission: RE | Admit: 2025-01-11 | Discharge: 2025-01-11 | Disposition: A | Source: Ambulatory Visit | Attending: Radiation Oncology | Admitting: Radiation Oncology

## 2025-01-11 ENCOUNTER — Inpatient Hospital Stay

## 2025-01-11 DIAGNOSIS — C50919 Malignant neoplasm of unspecified site of unspecified female breast: Secondary | ICD-10-CM

## 2025-01-11 LAB — RAD ONC ARIA SESSION SUMMARY
Course Elapsed Days: 9
Plan Fractions Treated to Date: 8
Plan Prescribed Dose Per Fraction: 3 Gy
Plan Total Fractions Prescribed: 10
Plan Total Prescribed Dose: 30 Gy
Reference Point Dosage Given to Date: 24 Gy
Reference Point Session Dosage Given: 3 Gy
Session Number: 8

## 2025-01-11 LAB — CBC (CANCER CENTER ONLY)
HCT: 28.6 % — ABNORMAL LOW (ref 36.0–46.0)
Hemoglobin: 8.9 g/dL — ABNORMAL LOW (ref 12.0–15.0)
MCH: 28.5 pg (ref 26.0–34.0)
MCHC: 31.1 g/dL (ref 30.0–36.0)
MCV: 91.7 fL (ref 80.0–100.0)
Platelet Count: 131 K/uL — ABNORMAL LOW (ref 150–400)
RBC: 3.12 MIL/uL — ABNORMAL LOW (ref 3.87–5.11)
RDW: 16.5 % — ABNORMAL HIGH (ref 11.5–15.5)
WBC Count: 2.2 K/uL — ABNORMAL LOW (ref 4.0–10.5)
nRBC: 2.3 % — ABNORMAL HIGH (ref 0.0–0.2)

## 2025-01-11 MED ORDER — ONDANSETRON HCL 8 MG PO TABS: 8.0000 mg | ORAL_TABLET | Freq: Three times a day (TID) | ORAL | 1 refills | Status: AC | PRN

## 2025-01-12 ENCOUNTER — Other Ambulatory Visit: Payer: Self-pay

## 2025-01-12 ENCOUNTER — Ambulatory Visit
Admission: RE | Admit: 2025-01-12 | Discharge: 2025-01-12 | Disposition: A | Discharge status: Home / Self Care | Source: Ambulatory Visit | Attending: Radiation Oncology | Admitting: Radiation Oncology

## 2025-01-12 LAB — RAD ONC ARIA SESSION SUMMARY
Course Elapsed Days: 10
Plan Fractions Treated to Date: 9
Plan Prescribed Dose Per Fraction: 3 Gy
Plan Total Fractions Prescribed: 10
Plan Total Prescribed Dose: 30 Gy
Reference Point Dosage Given to Date: 27 Gy
Reference Point Session Dosage Given: 3 Gy
Session Number: 9

## 2025-01-13 ENCOUNTER — Other Ambulatory Visit: Payer: Self-pay

## 2025-01-13 ENCOUNTER — Ambulatory Visit
Admission: RE | Admit: 2025-01-13 | Discharge: 2025-01-13 | Disposition: A | Source: Ambulatory Visit | Attending: Radiation Oncology | Admitting: Radiation Oncology

## 2025-01-13 LAB — RAD ONC ARIA SESSION SUMMARY
Course Elapsed Days: 11
Plan Fractions Treated to Date: 10
Plan Prescribed Dose Per Fraction: 3 Gy
Plan Total Fractions Prescribed: 10
Plan Total Prescribed Dose: 30 Gy
Reference Point Dosage Given to Date: 30 Gy
Reference Point Session Dosage Given: 3 Gy
Session Number: 10

## 2025-01-16 NOTE — Radiation Completion Notes (Signed)
 Patient Name: Melchior, Anaid MRN: [id number] Date of Birth: May 24, 1941 Referring Physician: JERONA SAYRE, M.D. Date of Service: 2025-01-16 Radiation Oncologist: Marcey Penton, M.D. [facility] [place]                             RADIATION ONCOLOGY END OF TREATMENT NOTE     Diagnosis: C79.51 Secondary malignant neoplasm of bone Staging on 2024-12-02: Invasive carcinoma of breast (HCC) T=cTX, N=cNX, M=pM1 Intent: Palliative     HPI: Patient is a an 84 year old female status post mastectomy back in 2015 although this declined chemotherapy or radiation therapy and.  She had a workup for myeloma which was negative.  Her MRI of her sacrum thoracic spine shows diffuse abnormal marrow signal throughout the spine considering metastatic disease lymphoproliferative disorder less likely myeloma.  She also has lumbar compression fractures of L1-L2 with moderate right neuroforaminal stenosis at L1 due to retropulsion.  PET scan has been performed and on my review since has not been formally read shows widespread involvement of the spine multiple levels including her sacrum and S5 vertebral body.  She has been started on Femara  by medical oncology.  She is now referred to radiation oncology for consistent consideration of palliative treatment.      ==========DELIVERED PLANS==========  First Treatment Date: 2025-01-02 Last Treatment Date: 2025-01-13   Plan Name: Spine Site: Lumbar Spine Technique: 3D Mode: Photon Dose Per Fraction: 3 Gy Prescribed Dose (Delivered / Prescribed): 30 Gy / 30 Gy Prescribed Fxs (Delivered / Prescribed): 10 / 10     ==========ON TREATMENT VISIT DATES========== 2025-01-03, 2025-01-10     ==========UPCOMING VISITS========== 03/07/2025 [facility] TELEPHONE OFFICE VISIT [facility]orders, Fonda SAUNDERS, NP  02/16/2025 [facility] INFUSION [facility]ION [facility]AIR 8  02/16/2025 [facility] PALLIATIVE CARE    [facility], Fonda SAUNDERS,  NP  02/16/2025 [facility] EST PT Jacobo Evalene PARAS, MD  02/16/2025 [facility] LAB [facility]  02/13/2025 [facility] RAD ONCOLOGY FOLLOW UP 30 Chrystal, Glenn, MD        ==========APPENDIX - ON TREATMENT VISIT NOTES==========   See weekly On Treatment Notes in Epic for details in the Media tab (listed as Progress notes on the On Treatment Visit Dates listed above).

## 2025-01-24 ENCOUNTER — Emergency Department

## 2025-01-24 ENCOUNTER — Other Ambulatory Visit: Payer: Self-pay

## 2025-01-24 ENCOUNTER — Inpatient Hospital Stay
Admission: EM | Admit: 2025-01-24 | Source: Home / Self Care | Attending: Internal Medicine | Admitting: Internal Medicine

## 2025-01-24 DIAGNOSIS — C50919 Malignant neoplasm of unspecified site of unspecified female breast: Secondary | ICD-10-CM | POA: Diagnosis not present

## 2025-01-24 DIAGNOSIS — I824Y2 Acute embolism and thrombosis of unspecified deep veins of left proximal lower extremity: Secondary | ICD-10-CM | POA: Diagnosis not present

## 2025-01-24 DIAGNOSIS — E785 Hyperlipidemia, unspecified: Secondary | ICD-10-CM | POA: Diagnosis not present

## 2025-01-24 DIAGNOSIS — S22000A Wedge compression fracture of unspecified thoracic vertebra, initial encounter for closed fracture: Secondary | ICD-10-CM | POA: Diagnosis present

## 2025-01-24 DIAGNOSIS — S32000A Wedge compression fracture of unspecified lumbar vertebra, initial encounter for closed fracture: Secondary | ICD-10-CM | POA: Diagnosis present

## 2025-01-24 DIAGNOSIS — R531 Weakness: Secondary | ICD-10-CM

## 2025-01-24 DIAGNOSIS — D61818 Other pancytopenia: Secondary | ICD-10-CM | POA: Diagnosis present

## 2025-01-24 DIAGNOSIS — I82409 Acute embolism and thrombosis of unspecified deep veins of unspecified lower extremity: Secondary | ICD-10-CM | POA: Diagnosis present

## 2025-01-24 DIAGNOSIS — M8440XA Pathological fracture, unspecified site, initial encounter for fracture: Secondary | ICD-10-CM | POA: Diagnosis present

## 2025-01-24 HISTORY — DX: Other pancytopenia: D61.818

## 2025-01-24 LAB — BASIC METABOLIC PANEL WITH GFR
Anion gap: 15 (ref 5–15)
BUN: 14 mg/dL (ref 8–23)
CO2: 24 mmol/L (ref 22–32)
Calcium: 8.6 mg/dL — ABNORMAL LOW (ref 8.9–10.3)
Chloride: 99 mmol/L (ref 98–111)
Creatinine, Ser: 0.89 mg/dL (ref 0.44–1.00)
GFR, Estimated: >60 mL/min
Glucose, Bld: 146 mg/dL — ABNORMAL HIGH (ref 70–99)
Potassium: 4.4 mmol/L (ref 3.5–5.1)
Sodium: 137 mmol/L (ref 135–145)

## 2025-01-24 LAB — CBC
HCT: 28.3 % — ABNORMAL LOW (ref 36.0–46.0)
Hemoglobin: 8.9 g/dL — ABNORMAL LOW (ref 12.0–15.0)
MCH: 28.5 pg (ref 26.0–34.0)
MCHC: 31.4 g/dL (ref 30.0–36.0)
MCV: 90.7 fL (ref 80.0–100.0)
Platelets: 137 10*3/uL — ABNORMAL LOW (ref 150–400)
RBC: 3.12 MIL/uL — ABNORMAL LOW (ref 3.87–5.11)
RDW: 16.7 % — ABNORMAL HIGH (ref 11.5–15.5)
WBC: 3.6 10*3/uL — ABNORMAL LOW (ref 4.0–10.5)
nRBC: 2.3 % — ABNORMAL HIGH (ref 0.0–0.2)

## 2025-01-24 MED ORDER — ONDANSETRON HCL 4 MG/2ML IJ SOLN
4.0000 mg | Freq: Three times a day (TID) | INTRAMUSCULAR | Status: AC | PRN
  Administered 2025-01-26: 4 mg via INTRAVENOUS
  Filled 2025-01-24: qty 2

## 2025-01-24 MED ORDER — HYDROCODONE-ACETAMINOPHEN 5-325 MG PO TABS
2.0000 | ORAL_TABLET | Freq: Four times a day (QID) | ORAL | Status: AC | PRN
  Administered 2025-01-25 – 2025-01-27 (×8): 2 via ORAL
  Filled 2025-01-24 (×8): qty 2

## 2025-01-24 MED ORDER — HYDROCODONE-ACETAMINOPHEN 5-325 MG PO TABS
2.0000 | ORAL_TABLET | Freq: Once | ORAL | Status: AC
  Administered 2025-01-24: 2 via ORAL
  Filled 2025-01-24: qty 2

## 2025-01-24 MED ORDER — IOHEXOL 350 MG/ML SOLN
75.0000 mL | Freq: Once | INTRAVENOUS | Status: AC | PRN
  Administered 2025-01-24: 75 mL via INTRAVENOUS

## 2025-01-24 MED ORDER — HEPARIN BOLUS VIA INFUSION
4000.0000 [IU] | Freq: Once | INTRAVENOUS | Status: AC
  Administered 2025-01-24: 4000 [IU] via INTRAVENOUS
  Filled 2025-01-24: qty 4000

## 2025-01-24 MED ORDER — ACETAMINOPHEN 325 MG PO TABS
650.0000 mg | ORAL_TABLET | Freq: Four times a day (QID) | ORAL | Status: AC | PRN
  Administered 2025-01-26: 650 mg via ORAL
  Filled 2025-01-24: qty 2

## 2025-01-24 MED ORDER — ALBUTEROL SULFATE (2.5 MG/3ML) 0.083% IN NEBU: 2.5000 mg | INHALATION_SOLUTION | RESPIRATORY_TRACT | Status: AC | PRN

## 2025-01-24 MED ORDER — HEPARIN (PORCINE) 25000 UT/250ML-% IV SOLN
950.0000 [IU]/h | INTRAVENOUS | Status: DC
  Administered 2025-01-24 – 2025-01-26 (×3): 950 [IU]/h via INTRAVENOUS
  Filled 2025-01-24 (×3): qty 250

## 2025-01-24 MED ORDER — ALBUTEROL SULFATE HFA 108 (90 BASE) MCG/ACT IN AERS: 2.0000 | INHALATION_SPRAY | RESPIRATORY_TRACT | Status: DC | PRN

## 2025-01-24 NOTE — Progress Notes (Signed)
 ANTICOAGULATION CONSULT NOTE  Pharmacy Consult for heparin  infusion Indication: VTE Tx  Allergies[1]  Patient Measurements: Height: 5' 6 (167.6 cm) Weight: 59 kg (130 lb) IBW/kg (Calculated) : 59.3 HEPARIN  DW (KG): 59  Vital Signs: Temp: 99.6 F (37.6 C) (02/03 1822) Temp Source: Oral (02/03 1822) BP: 106/68 (02/03 1822) Pulse Rate: 66 (02/03 1822)  Labs: Recent Labs    01/24/25 1538  HGB 8.9*  HCT 28.3*  PLT 137*  CREATININE 0.89    Estimated Creatinine Clearance: 43 mL/min (by C-G formula based on SCr of 0.89 mg/dL).   Medical History: Past Medical History:  Diagnosis Date   Breast cancer (HCC) 10/22/2013   Mastectomy in 2015   Cancer [facility])    right breast 12/06/2013   GERD (gastroesophageal reflux disease)    Hyperlipidemia    Insomnia    Macular degeneration of right eye    Osteoarthritis    Pancytopenia (HCC)    Pneumonia    Pneumothorax     Assessment: Pt is a 84 yo female presenting to ED c/o sob, dizziness, left pain, and swelling, found with occlusive DVT is noted in the left superficial femoral vein and nonocclusive DVT is noted in the left common femoral and profunda femoral veins.  Goal of Therapy:  Heparin  level 0.3-0.7 units/ml Monitor platelets by anticoagulation protocol: Yes   Plan:  Bolus 4000 units x 1 Start heparin  infusion at 950 units/hr Will check HL in 8 hr after start of infusion CBC daily while on heparin   Rankin CANDIE Dills, PharmD, [facility] 01/24/2025 11:13 PM      [1] No Known Allergies

## 2025-01-24 NOTE — ED Triage Notes (Addendum)
 Pt comes with c/o sob and dizziness that has been on going. Pt gets radiation thought it was from that because she has it often. Pt has radiation week ago.  Pt came today bc of left pain and swelling. Pt states it is so heavy she can't lift it. Pt nurse came to her house last week and her leg was fine. Pt sates she noticed it overnight.  Pt not on thinners

## 2025-01-25 ENCOUNTER — Other Ambulatory Visit (HOSPITAL_COMMUNITY): Payer: Self-pay

## 2025-01-25 ENCOUNTER — Telehealth (HOSPITAL_COMMUNITY): Payer: Self-pay | Admitting: Pharmacy Technician

## 2025-01-25 DIAGNOSIS — Z923 Personal history of irradiation: Secondary | ICD-10-CM

## 2025-01-25 DIAGNOSIS — C799 Secondary malignant neoplasm of unspecified site: Secondary | ICD-10-CM

## 2025-01-25 DIAGNOSIS — E7800 Pure hypercholesterolemia, unspecified: Secondary | ICD-10-CM

## 2025-01-25 DIAGNOSIS — I82412 Acute embolism and thrombosis of left femoral vein: Secondary | ICD-10-CM | POA: Diagnosis not present

## 2025-01-25 DIAGNOSIS — C50919 Malignant neoplasm of unspecified site of unspecified female breast: Secondary | ICD-10-CM | POA: Diagnosis not present

## 2025-01-25 LAB — BASIC METABOLIC PANEL WITH GFR
Anion gap: 12 (ref 5–15)
BUN: 15 mg/dL (ref 8–23)
CO2: 24 mmol/L (ref 22–32)
Calcium: 8.4 mg/dL — ABNORMAL LOW (ref 8.9–10.3)
Chloride: 100 mmol/L (ref 98–111)
Creatinine, Ser: 0.81 mg/dL (ref 0.44–1.00)
GFR, Estimated: >60 mL/min
Glucose, Bld: 98 mg/dL (ref 70–99)
Potassium: 4.2 mmol/L (ref 3.5–5.1)
Sodium: 136 mmol/L (ref 135–145)

## 2025-01-25 LAB — HEPARIN LEVEL (UNFRACTIONATED)
Heparin Unfractionated: 0.59 [IU]/mL (ref 0.30–0.70)
Heparin Unfractionated: 0.57 [IU]/mL (ref 0.30–0.70)

## 2025-01-25 LAB — CBC
HCT: 26 % — ABNORMAL LOW (ref 36.0–46.0)
Hemoglobin: 8.3 g/dL — ABNORMAL LOW (ref 12.0–15.0)
MCH: 28.6 pg (ref 26.0–34.0)
MCHC: 31.9 g/dL (ref 30.0–36.0)
MCV: 89.7 fL (ref 80.0–100.0)
Platelets: 120 10*3/uL — ABNORMAL LOW (ref 150–400)
RBC: 2.9 MIL/uL — ABNORMAL LOW (ref 3.87–5.11)
RDW: 16.7 % — ABNORMAL HIGH (ref 11.5–15.5)
WBC: 3.1 10*3/uL — ABNORMAL LOW (ref 4.0–10.5)
nRBC: 2.6 % — ABNORMAL HIGH (ref 0.0–0.2)

## 2025-01-25 LAB — PROTIME-INR
INR: 1 (ref 0.8–1.2)
Prothrombin Time: 13.5 s (ref 11.4–15.2)

## 2025-01-25 LAB — APTT: aPTT: 28 s (ref 24–36)

## 2025-01-25 MED ORDER — PANTOPRAZOLE SODIUM 40 MG PO TBEC
40.0000 mg | DELAYED_RELEASE_TABLET | Freq: Every day | ORAL | Status: AC
  Administered 2025-01-25 – 2025-01-27 (×3): 40 mg via ORAL
  Filled 2025-01-25 (×3): qty 1

## 2025-01-25 MED ORDER — DIPHENHYDRAMINE HCL 25 MG PO CAPS
25.0000 mg | ORAL_CAPSULE | Freq: Every day | ORAL | Status: AC
  Administered 2025-01-25 – 2025-01-27 (×3): 25 mg via ORAL
  Filled 2025-01-25 (×3): qty 1

## 2025-01-25 MED ORDER — OYSTER SHELL CALCIUM/D3 500-5 MG-MCG PO TABS
1.0000 | ORAL_TABLET | Freq: Every day | ORAL | Status: AC
  Administered 2025-01-25 – 2025-01-27 (×3): 1 via ORAL
  Filled 2025-01-25 (×3): qty 1

## 2025-01-25 MED ORDER — PRAVASTATIN SODIUM 20 MG PO TABS
20.0000 mg | ORAL_TABLET | Freq: Every day | ORAL | Status: AC
  Administered 2025-01-25 – 2025-01-27 (×3): 20 mg via ORAL
  Filled 2025-01-25 (×3): qty 1

## 2025-01-25 MED ORDER — VITAMIN D3 25 MCG (1000 UNIT) PO TABS
2000.0000 [IU] | ORAL_TABLET | Freq: Every day | ORAL | Status: AC
  Administered 2025-01-25 – 2025-01-27 (×3): 2000 [IU] via ORAL
  Filled 2025-01-25 (×6): qty 2

## 2025-01-25 MED ORDER — LETROZOLE 2.5 MG PO TABS
2.5000 mg | ORAL_TABLET | Freq: Every day | ORAL | Status: AC
  Administered 2025-01-25 – 2025-01-27 (×3): 2.5 mg via ORAL
  Filled 2025-01-25 (×3): qty 1

## 2025-01-25 MED ORDER — MAGNESIUM OXIDE -MG SUPPLEMENT 400 (240 MG) MG PO TABS
200.0000 mg | ORAL_TABLET | Freq: Every day | ORAL | Status: AC
  Administered 2025-01-25 – 2025-01-27 (×3): 200 mg via ORAL
  Filled 2025-01-25 (×3): qty 1

## 2025-01-25 MED ORDER — DOCUSATE SODIUM 100 MG PO CAPS
100.0000 mg | ORAL_CAPSULE | Freq: Every day | ORAL | Status: AC
  Administered 2025-01-25 – 2025-01-27 (×3): 100 mg via ORAL
  Filled 2025-01-25 (×3): qty 1

## 2025-01-25 MED ORDER — TRAZODONE HCL 50 MG PO TABS
50.0000 mg | ORAL_TABLET | Freq: Every day | ORAL | Status: AC
  Administered 2025-01-25 – 2025-01-27 (×3): 50 mg via ORAL
  Filled 2025-01-25 (×3): qty 1

## 2025-01-25 NOTE — Progress Notes (Addendum)
 ANTICOAGULATION CONSULT NOTE  Pharmacy Consult for heparin  infusion Indication: VTE Tx  Allergies[1]  Patient Measurements: Height: 5' 6 (167.6 cm) Weight: 59 kg (130 lb) IBW/kg (Calculated) : 59.3 HEPARIN  DW (KG): 59  Vital Signs: Temp: 98.1 F (36.7 C) (02/04 1006) Temp Source: Oral (02/04 1006) BP: 110/65 (02/04 0930) Pulse Rate: 88 (02/04 0930)  Labs: Recent Labs    01/24/25 1538 01/24/25 2342 01/25/25 0440 01/25/25 0941  HGB 8.9*  --  8.3*  --   HCT 28.3*  --  26.0*  --   PLT 137*  --  120*  --   APTT  --  28  --   --   LABPROT  --  13.5  --   --   INR  --  1.0  --   --   HEPARINUNFRC  --   --   --  0.59  CREATININE 0.89  --  0.81  --     Estimated Creatinine Clearance: 47.3 mL/min (by C-G formula based on SCr of 0.81 mg/dL).   Medical History: Past Medical History:  Diagnosis Date   Breast cancer (HCC) 10/22/2013   Mastectomy in 2015   Cancer [facility])    right breast 12/06/2013   GERD (gastroesophageal reflux disease)    Hyperlipidemia    Insomnia    Macular degeneration of right eye    Osteoarthritis    Pancytopenia (HCC)    Pneumonia    Pneumothorax     Assessment: Pt is a 84 yo female presenting to ED c/o sob, dizziness, left pain, and swelling, found with occlusive DVT is noted in the left superficial femoral vein and nonocclusive DVT is noted in the left common femoral and profunda femoral veins.  Goal of Therapy:  Heparin  level 0.3-0.7 units/ml Monitor platelets by anticoagulation protocol: Yes   02/04 0941 0.59, therapeutic x 1  Plan:  HL therapeutic x 1 Continue heparin  infusion at 950 units/hr Recheck HL in 8 hours to confirm Daily CBC while on heparin   [name], PharmD, BCPS 01/25/2025 10:09 AM       [1] No Known Allergies

## 2025-01-25 NOTE — Progress Notes (Signed)
 [facility]  - [place] [facility]   PATIENT NAME: Samantha Ford    MR#:  [id number]  DATE OF BIRTH:  02/06/1941  SUBJECTIVE:  no family at bedside. Patient came in with increasing swelling of the left lower extremity. She has history of breast cancer. She has meds to the spine. Getting radiation treatment.     VITALS:  Blood pressure (!) 98/58, pulse 82, temperature 98.1 F (36.7 C), temperature source Oral, resp. rate 16, height 5' 6 (1.676 m), weight 59 kg, SpO2 96%.  PHYSICAL EXAMINATION:   GENERAL:  84 y.o.-year-old patient with no acute distress.  LUNGS: Normal breath sounds bilaterally, no wheezing CARDIOVASCULAR: S1, S2 normal. No murmur   ABDOMEN: Soft, nontender, nondistended. Bowel sounds present.  EXTREMITIES:  NEUROLOGIC: nonfocal  patient is alert and awake   LABORATORY PANEL:  CBC Recent Labs  Lab 01/25/25 0440  WBC 3.1*  HGB 8.3*  HCT 26.0*  PLT 120*    Chemistries  Recent Labs  Lab 01/25/25 0440  NA 136  K 4.2  CL 100  CO2 24  GLUCOSE 98  BUN 15  CREATININE 0.81  CALCIUM  8.4*   Cardiac Enzymes No results for input(s): TROPONINI in the last 168 hours. RADIOLOGY:  CT Angio Chest PE W and/or Wo Contrast Result Date: 01/24/2025 EXAM: CTA of the Chest with contrast for PE 01/24/2025 09:58:03 PM TECHNIQUE: CTA of the chest was performed without then with the administration of 75 mL of iohexol  (OMNIPAQUE ) 350 MG/ML injection. Multiplanar reformatted images are provided for review. MIP images are provided for review. Automated exposure control, iterative reconstruction, and/or weight based adjustment of the mA/kV was utilized to reduce the radiation dose to as low as reasonably achievable. COMPARISON: PET CT 12/16/2024. CLINICAL HISTORY: Pulmonary embolism (PE) suspected, high probability. FINDINGS: PULMONARY ARTERIES: Pulmonary arteries are adequately opacified for evaluation. No pulmonary embolism. Main pulmonary arteries enlarged  compatible with pulmonary artery hypertension. MEDIASTINUM: The heart is mildly enlarged. Coronary and aortic atherosclerotic calcifications are noted. There is no acute abnormality of the thoracic aorta. LYMPH NODES: Praminence but nonenlarged subcarinal lymph nodes appear unchanged. No enlarged axillary lymph nodes are seen. No other mediastinal or hilar lymphadenopathy. LUNGS AND PLEURA: Atelectatic changes and ground glass opacities are seen in the lingula and bilateral lower lobes. No pleural effusion or pneumothorax identified. UPPER ABDOMEN: There is a large hiatal hernia containing the proximal stomach. SOFT TISSUES AND BONES: Old left sided rib fractures. Bones are diffusely osteopenic. Lucent metastatic lesions are seen scattered throughout the vertebral bodies most significant at T3 and T6 which have progressed. T7 compression deformity, T12 and L1 compression deformities appear unchanged. No acute soft tissue abnormality. IMPRESSION: 1. No pulmonary embolism. 2. Enlarged main pulmonary arteries, consistent with pulmonary arterial hypertension. 3. Progression of lucent metastatic lesions in the vertebral bodies, most significant at T3 and T6. 4. Large hiatal hernia containing the proximal stomach. 5. Stable T7, T12, and L1 compression deformities. Electronically signed by: Greig Pique MD 01/24/2025 10:11 PM EST RP Workstation: [id number]   US  Venous Img Lower Unilateral Left Result Date: 01/24/2025 CLINICAL DATA:  Left lower extremity pain and swelling. EXAM: Left LOWER EXTREMITY VENOUS DOPPLER ULTRASOUND TECHNIQUE: Gray-scale sonography with graded compression, as well as color Doppler and duplex ultrasound were performed to evaluate the lower extremity deep venous systems from the level of the common femoral vein and including the common femoral, femoral, profunda femoral, popliteal and calf veins including the posterior tibial, peroneal and gastrocnemius veins  when visible. The superficial great  saphenous vein was also interrogated. Spectral Doppler was utilized to evaluate flow at rest and with distal augmentation maneuvers in the common femoral, femoral and popliteal veins. COMPARISON:  None Available. FINDINGS: Contralateral Common Femoral Vein: No evidence of thrombus is noted. Common Femoral Vein: Partial compressibility is noted suggesting nonocclusive thrombus. Saphenofemoral Junction: No evidence of thrombus. Normal compressibility and flow on color Doppler imaging. Profunda Femoral Vein: Partial compressibility is noted suggesting nonocclusive thrombus. Femoral Vein: Noncompressible consistent with occlusive thrombus. Popliteal Vein: No evidence of thrombus. Normal compressibility, respiratory phasicity and response to augmentation. Calf Veins: No evidence of thrombus. Normal compressibility and flow on color Doppler imaging. Superficial Great Saphenous Vein: No evidence of thrombus. Normal compressibility. Venous Reflux:  None. Other Findings:  None. IMPRESSION: Occlusive deep venous thrombosis is noted in the left superficial femoral vein. Nonocclusive deep venous thrombosis is noted in the left common femoral and profunda femoral veins. Electronically Signed   By: Lynwood Landy Raddle M.D.   On: 01/24/2025 17:23   DG Chest 2 View Result Date: 01/24/2025 CLINICAL DATA:  Shortness of breath. EXAM: CHEST - 2 VIEW COMPARISON:  Radiograph 06/24/2021, chest CT 11/24/2024 FINDINGS: Chronic elevation of right hemidiaphragm with adjacent atelectasis/scarring. The heart is stable in size, aortic atherosclerosis. Retrocardiac hiatal hernia. Subsegmental atelectasis/scarring in the left mid lower lung zone. No acute airspace disease. No pulmonary edema. No pneumothorax or significant pleural effusion. Remote left rib fractures. Chronic compression deformities in the thoracic and lumbar spine. Left shoulder arthroplasty is partially included. IMPRESSION: 1. Chronic elevation of right hemidiaphragm with adjacent  atelectasis/scarring. 2. Subsegmental atelectasis/scarring in the left mid lower lung zone. 3. Retrocardiac hiatal hernia. Electronically Signed   By: Andrea Gasman M.D.   On: 01/24/2025 16:13    Assessment and Plan   Brendan Gruwell is a 84 y.o. female with medical history significant of stage IV breast cancer (s/p of right mastectomy, currently on radiation therapy), pathologic fracture with thoracic and lumbar spine compression fracture, HLD,  GERD, pneumothorax, arthritis, who presents with left leg pain and swelling.   Left lower extremity:Occlusive deep venous thrombosis is noted in the left superficial femoral vein. Nonocclusive deep venous thrombosis is noted in the left common femoral and profunda femoral veins.   CT chest angiogram 1. No pulmonary embolism. 2. Enlarged main pulmonary arteries, consistent with pulmonary arterial hypertension. 3. Progression of lucent metastatic lesions in the vertebral bodies, most significant at T3 and T6. 4. Large hiatal hernia containing the proximal stomach. 5. Stable T7, T12, and L1 compression deformities.   Acute DVT (deep venous thrombosis)_left leg: Left lower extremity venous Doppler showed nonocclusive deep venous thrombosis in theleft common femoral and profunda femoral veins, and occlusive deep venous thrombosis in the left superficial femoral vein.  CTA negative for PE. -heparin  drip initiated -pain control: prn Tylenol , Norco for pain -INR/PTT -Dr. Serene of vascular surgeon--recommends not vascular procedure at present. Change to po DOAC in am   Pancytopenia [facility]): Hemoglobin stable.  Patient had WBC 2.2, hemoglobin 8.9, platelet 137 on 01/11/2025 --> today WBC 3.6, hemoglobin 8.9, platelet 137. - Follow-up repeat CBC   HLD (hyperlipidemia) -Pravastatin    Invasive carcinoma of breast [facility]): Patient is currently receiving radiation therapy, last dose was on 01/13/2025 per patient. - Continue letrozole    Hx of pathological  fracture, and lumbar compression fracture, and thoracic compression fracture (HCC): -As needed Norco, Tylenol  - Fall precaution     DVT ppx: on IV Heparin   Code Status: Full code per pt Family Communication:  none today in the ER  Consults :Vascular sx  Level of care: Telemetry Status is: Inpatient Remains inpatient appropriate because: Acute DVT on IV heparin  gtt    TOTAL TIME TAKING CARE OF THIS PATIENT: 40 minutes.  >50% time spent on counselling and coordination of care  Note: This dictation was prepared with Dragon dictation along with smaller phrase technology. Any transcriptional errors that result from this process are unintentional.  Leita Ford M.D    [facility]   CC: Primary care physician; Diedra Lame, MD

## 2025-01-25 NOTE — ED Notes (Signed)
 CCMD called and pt on monitor

## 2025-01-25 NOTE — ED Notes (Signed)
Patient resting in bed free from sign of distress. Breathing unlabored speaking in full sentences with symmetric chest rise and fall. Bed low and locked with side rails raised x2. Call bell in reach and monitor in place.   

## 2025-01-25 NOTE — ED Notes (Signed)
 Patient resting in bed free from sign of distress. Breathing unlabored speaking in full sentences with symmetric chest rise and fall. Bed low and locked with side rails raised x2. Call bell in reach and monitor in place. Patient given 2 additional warm blankets per request. Denies further needs at this time.

## 2025-01-25 NOTE — ED Notes (Signed)
 Patient assisted with going to bathroom. Standby assist for toilet in room.Patient now resting in bed free from sign of distress. Breathing unlabored speaking in full sentences with symmetric chest rise and fall. Bed low and locked with side rails raised x2. Call bell in reach and monitor in place.

## 2025-01-25 NOTE — Progress Notes (Signed)
 ANTICOAGULATION CONSULT NOTE  Pharmacy Consult for heparin  infusion Indication: VTE Tx  Allergies[1]  Patient Measurements: Height: 5' 6 (167.6 cm) Weight: 59 kg (130 lb) IBW/kg (Calculated) : 59.3 HEPARIN  DW (KG): 59  Vital Signs: Temp: 99.1 F (37.3 C) (02/04 1559) Temp Source: Oral (02/04 1559) BP: 105/60 (02/04 1559) Pulse Rate: 81 (02/04 1559)  Labs: Recent Labs    01/24/25 1538 01/24/25 2342 01/25/25 0440 01/25/25 0941 01/25/25 1707  HGB 8.9*  --  8.3*  --   --   HCT 28.3*  --  26.0*  --   --   PLT 137*  --  120*  --   --   APTT  --  28  --   --   --   LABPROT  --  13.5  --   --   --   INR  --  1.0  --   --   --   HEPARINUNFRC  --   --   --  0.59 0.57  CREATININE 0.89  --  0.81  --   --     Estimated Creatinine Clearance: 47.3 mL/min (by C-G formula based on SCr of 0.81 mg/dL).   Medical History: Past Medical History:  Diagnosis Date   Breast cancer (HCC) 10/22/2013   Mastectomy in 2015   Cancer [facility])    right breast 12/06/2013   GERD (gastroesophageal reflux disease)    Hyperlipidemia    Insomnia    Macular degeneration of right eye    Osteoarthritis    Pancytopenia (HCC)    Pneumonia    Pneumothorax     Assessment: Pt is a 84 yo female presenting to ED c/o sob, dizziness, left pain, and swelling, found with occlusive DVT is noted in the left superficial femoral vein and nonocclusive DVT is noted in the left common femoral and profunda femoral veins.  Goal of Therapy:  Heparin  level 0.3-0.7 units/ml Monitor platelets by anticoagulation protocol: Yes   02/04 0941 0.59, therapeutic x 1 02/04 1707 0.57, therapeutic x2  Plan:  HL therapeutic x 2 Continue heparin  infusion at 950 units/hr Recheck HL  daily with AM labs give x2 consecutive therapeutic levels  Daily CBC while on heparin   Samantha Ford, PharmD Clinical Pharmacist 01/25/2025 6:01 PM         [1] No Known Allergies

## 2025-01-25 NOTE — ED Notes (Signed)
 Pt assisted to and from toilet in room. Patient now resting in bed free from sign of distress. Breathing unlabored speaking in full sentences with symmetric chest rise and fall. Bed low and locked with side rails raised x2. Call bell in reach and monitor in place.

## 2025-01-25 NOTE — Consult Note (Signed)
 "                                   [facility]  Patient name: Samantha Ford MRN: [id number] DOB: May 25, 1941 Sex: female   REQUESTING PROVIDER:   ER   REASON FOR CONSULT:    DVT  HISTORY OF PRESENT ILLNESS:   Samantha Ford is a 84 y.o. female, who presented to the emergency department with left leg pain and swelling that began this morning.  She reports that she has been dizzy at home for several weeks and has not been very active.  She also has a history of stage IV breast cancer, currently receiving radiation therapy.  Ultrasound identified a left leg DVT.  Patient does not have a history of any prior DVTs.  She also complained of some chest discomfort.  A CT scan was negative for PE.  She has pathologic fractures in her spine.  She is a non-smoker.  She takes a statin for hypercholesterolemia.  PAST MEDICAL HISTORY    Past Medical History:  Diagnosis Date   Breast cancer (HCC) 10/22/2013   Mastectomy in 2015   Cancer [facility])    right breast 12/06/2013   GERD (gastroesophageal reflux disease)    Hyperlipidemia    Insomnia    Macular degeneration of right eye    Osteoarthritis    Pancytopenia (HCC)    Pneumonia    Pneumothorax      FAMILY HISTORY   Family History  Problem Relation Age of Onset   Colon cancer Sister    Cancer Brother    Breast cancer Neg Hx     SOCIAL HISTORY:   Social History   Socioeconomic History   Marital status: Widowed    Spouse name: Not on file   Number of children: 2   Years of education: Not on file   Highest education level: Not on file  Occupational History   Not on file  Tobacco Use   Smoking status: Never   Smokeless tobacco: Never  Vaping Use   Vaping status: Never Used  Substance and Sexual Activity   Alcohol use: Not Currently   Drug use: Never   Sexual activity: Not Currently  Other Topics Concern   Not on file  Social History Narrative   Grandson lives with her   Social  Drivers of Health   Tobacco Use: Low Risk (01/24/2025)   Patient History    Smoking Tobacco Use: Never    Smokeless Tobacco Use: Never    Passive Exposure: Not on file  Financial Resource Strain: Low Risk  (04/19/2024)   Received from [facility]   Overall Financial Resource Strain (CARDIA)    Difficulty of Paying Living Expenses: Not hard at all  Food Insecurity: No Food Insecurity (11/24/2024)   Epic    Worried About Radiation Protection Practitioner of Food in the Last Year: Never true    Ran Out of Food in the Last Year: Never true  Transportation Needs: No Transportation Needs (11/24/2024)   Epic    Lack of Transportation (Medical): No    Lack of Transportation (Non-Medical): No  Physical Activity: Not on file  Stress: Not on file  Social Connections: Moderately Integrated (11/24/2024)   Social Connection and Isolation Panel    Frequency of Communication with Friends and Family: Three times a week    Frequency of Social Gatherings with Friends and  Family: Three times a week    Attends Religious Services: 1 to 4 times per year    Active Member of Clubs or Organizations: Yes    Attends Banker Meetings: 1 to 4 times per year    Marital Status: Widowed  Intimate Partner Violence: Not At Risk (11/24/2024)   Epic    Fear of Current or Ex-Partner: No    Emotionally Abused: No    Physically Abused: No    Sexually Abused: No  Depression (PHQ2-9): Low Risk (01/05/2025)   Depression (PHQ2-9)    PHQ-2 Score: 0  Alcohol Screen: Not on file  Housing: Low Risk (11/24/2024)   Epic    Unable to Pay for Housing in the Last Year: No    Number of Times Moved in the Last Year: 0    Homeless in the Last Year: No  Utilities: Not At Risk (11/24/2024)   Epic    Threatened with loss of utilities: No  Health Literacy: Not on file    ALLERGIES:    Allergies[1]  CURRENT MEDICATIONS:    Current Facility-Administered Medications  Medication Dose Route Frequency Provider Last Rate  Last Admin   acetaminophen  (TYLENOL ) tablet 650 mg  650 mg Oral Q6H PRN Niu, Xilin, MD       albuterol  (PROVENTIL ) (2.5 MG/3ML) 0.083% nebulizer solution 2.5 mg  2.5 mg Nebulization Q4H PRN Dail Rankin RAMAN, RPH       heparin  ADULT infusion 100 units/mL (25000 units/233mL)  950 Units/hr Intravenous Continuous Dail Rankin RAMAN, RPH 9.5 mL/hr at 01/24/25 2343 950 Units/hr at 01/24/25 2343   HYDROcodone -acetaminophen  (NORCO/VICODIN) 5-325 MG per tablet 2 tablet  2 tablet Oral Q6H PRN Niu, Xilin, MD       ondansetron  (ZOFRAN ) injection 4 mg  4 mg Intravenous Q8H PRN Niu, Xilin, MD       Current Outpatient Medications  Medication Sig Dispense Refill   Cholecalciferol  (VITAMIN D3) 50 MCG CAPS Take 1 capsule by mouth at bedtime.     diphenhydrAMINE  (BENADRYL ) 25 MG tablet Take 25 mg by mouth at bedtime.     HYDROcodone -acetaminophen  (NORCO/VICODIN) 5-325 MG tablet Take 1 tablet by mouth every 4 (four) hours as needed for moderate pain (pain score 4-6) or severe pain (pain score 7-10). 60 tablet 0   ibandronate (BONIVA) 150 MG tablet Take 150 mg by mouth every 30 (thirty) days.     ibuprofen (ADVIL) 200 MG tablet Take 2 tablets (400 mg total) by mouth every 8 (eight) hours as needed.     letrozole  (FEMARA ) 2.5 MG tablet Take 1 tablet (2.5 mg total) by mouth daily. 90 tablet 3   Magnesium  250 MG TABS Take 1 tablet by mouth at bedtime.     omeprazole (PRILOSEC) 40 MG capsule Take 40 mg by mouth daily.     ondansetron  (ZOFRAN ) 8 MG tablet Take 1 tablet (8 mg total) by mouth every 8 (eight) hours as needed for nausea or vomiting. 20 tablet 3   pravastatin  (PRAVACHOL ) 20 MG tablet Take 20 mg by mouth at bedtime.     traZODone  (DESYREL ) 50 MG tablet Take 50 mg by mouth at bedtime.     acetaminophen  (TYLENOL ) 325 MG tablet Take 2 tablets (650 mg total) by mouth every 8 (eight) hours as needed for mild pain (pain score 1-3), fever or headache. (Patient not taking: Reported on 01/25/2025)     Calcium   Carb-Cholecalciferol  (OYSTER SHELL CALCIUM  W/D) 500-5 MG-MCG TABS Take 1 tablet by mouth at bedtime.  lidocaine  (LIDODERM ) 5 % Place 1 patch onto the skin daily. Remove & Discard patch within 12 hours or as directed by MD (Patient not taking: Reported on 01/25/2025) 30 patch 0   ondansetron  (ZOFRAN ) 8 MG tablet Take 1 tablet (8 mg total) by mouth every 8 (eight) hours as needed for nausea or vomiting. (Patient not taking: Reported on 01/25/2025) 30 tablet 1   ondansetron  (ZOFRAN -ODT) 4 MG disintegrating tablet Take 1 tablet (4 mg total) by mouth daily at 6 (six) AM. (Patient not taking: Reported on 01/25/2025) 30 tablet 0   tiZANidine  (ZANAFLEX ) 2 MG tablet Take 1 tablet (2 mg total) by mouth every 8 (eight) hours as needed. (Patient not taking: Reported on 01/25/2025) 30 tablet 1    REVIEW OF SYSTEMS:   [X]  denotes positive finding, [ ]  denotes negative finding Cardiac  Comments:  Chest pain or chest pressure:    Shortness of breath upon exertion:    Short of breath when lying flat:    Irregular heart rhythm:        Vascular    Pain in calf, thigh, or hip brought on by ambulation:    Pain in feet at night that wakes you up from your sleep:     Blood clot in your veins:    Leg swelling:  x       Pulmonary    Oxygen at home:    Productive cough:     Wheezing:         Neurologic    Sudden weakness in arms or legs:     Sudden numbness in arms or legs:     Sudden onset of difficulty speaking or slurred speech:    Temporary loss of vision in one eye:     Problems with dizziness:         Gastrointestinal    Blood in stool:      Vomited blood:         Genitourinary    Burning when urinating:     Blood in urine:        Psychiatric    Major depression:         Hematologic    Bleeding problems:    Problems with blood clotting too easily:        Skin    Rashes or ulcers:        Constitutional    Fever or chills:     PHYSICAL EXAM:   Vitals:   01/24/25 2313 01/24/25 2315  01/24/25 2330 01/25/25 0000  BP: (!) 99/56 (!) 99/56 118/62 (!) 105/57  Pulse: 82 81 87 86  Resp: 19 16 20  (!) 21  Temp: 98.6 F (37 C)     TempSrc: Oral     SpO2: 97% 96% 97% 98%  Weight:      Height:        GENERAL: The patient is a well-nourished female, in no acute distress. The vital signs are documented above. CARDIAC: There is a regular rate and rhythm.  VASCULAR: Edematous and tender left leg.  No signs of arterial compromise PULMONARY: Nonlabored respirations ABDOMEN: Soft and non-tender   MUSCULOSKELETAL: There are no major deformities or cyanosis. NEUROLOGIC: No focal weakness or paresthesias are detected. SKIN: There are no ulcers or rashes noted. PSYCHIATRIC: The patient has a normal affect.  STUDIES:   I have reviewed the following U/S Contralateral Common Femoral Vein: No evidence of thrombus is noted.   Common Femoral Vein: Partial compressibility is noted  suggesting nonocclusive thrombus.   Saphenofemoral Junction: No evidence of thrombus. Normal compressibility and flow on color Doppler imaging.   Profunda Femoral Vein: Partial compressibility is noted suggesting nonocclusive thrombus.   Femoral Vein: Noncompressible consistent with occlusive thrombus.   Popliteal Vein: No evidence of thrombus. Normal compressibility, respiratory phasicity and response to augmentation.   Calf Veins: No evidence of thrombus. Normal compressibility and flow on color Doppler imaging.   Superficial Great Saphenous Vein: No evidence of thrombus. Normal compressibility.   Venous Reflux:  None.   CTA:   1. No pulmonary embolism. 2. Enlarged main pulmonary arteries, consistent with pulmonary arterial hypertension. 3. Progression of lucent metastatic lesions in the vertebral bodies, most significant at T3 and T6. 4. Large hiatal hernia containing the proximal stomach. 5. Stable T7, T12, and L1 compression deformities.     ASSESSMENT and PLAN   Left leg DVT: This  is a provoked DVT given her recent inactivity and history of metastatic breast cancer.  She does not have evidence of phlegmasia.  The left foot is warm and well-perfused and she has normal neurologic function.  Her DVT is nonocclusive in the common femoral vein the profundus is nonocclusive and the femoral vein is occlusive.  The popliteal and great saphenous veins appear patent.  Due to the extent of her DVT at this time, I would not recommend mechanical thrombectomy as the common femoral vein is nonocclusive.  I would recommend leg elevation and IV heparin  which can be transition to a DOAC.  If she has persistent symptoms of leg swelling and discomfort, consideration for mechanical thrombectomy can be made, if she fails external treatment with anticoagulation.   Malvina Serene CLORE, MD, FACS [facility]  Pager [phone]      [1] No Known Allergies  "

## 2025-01-25 NOTE — ED Notes (Signed)
 This NT assisted pt in getting syrup covered shirt off and into clean gown. Pt now resting comfortably in bed.

## 2025-01-25 NOTE — Telephone Encounter (Signed)
 Patient Product/process Development Scientist completed.    The patient is insured through [employer]. Patient has Medicare and is not eligible for a copay card, but may be able to apply for patient assistance or Medicare RX Payment Plan (Patient Must reach out to their plan, if eligible for payment plan), if available.    Ran test claim for Eliquis 5 mg and the current 30 day co-pay is $249.00 due to a deductible.  Ran test claim for Xarelto 20 mg and the current 30 day co-pay is $206.73 due to a deductible.  This test claim was processed through [facility]- copay amounts may vary at other pharmacies due to pharmacy/plan contracts, or as the patient moves through the different stages of their insurance plan.     Reyes Sharps, CPHT Pharmacy Technician Patient Advocate Specialist Lead [facility] Patient Advocate Team Direct Number: [phone]  Fax: [phone]

## 2025-01-26 LAB — CBC
HCT: 25.6 % — ABNORMAL LOW (ref 36.0–46.0)
Hemoglobin: 7.9 g/dL — ABNORMAL LOW (ref 12.0–15.0)
MCH: 28.1 pg (ref 26.0–34.0)
MCHC: 30.9 g/dL (ref 30.0–36.0)
MCV: 91.1 fL (ref 80.0–100.0)
Platelets: 129 10*3/uL — ABNORMAL LOW (ref 150–400)
RBC: 2.81 MIL/uL — ABNORMAL LOW (ref 3.87–5.11)
RDW: 16.9 % — ABNORMAL HIGH (ref 11.5–15.5)
WBC: 2.5 10*3/uL — ABNORMAL LOW (ref 4.0–10.5)
nRBC: 2.4 % — ABNORMAL HIGH (ref 0.0–0.2)

## 2025-01-26 LAB — HEPARIN LEVEL (UNFRACTIONATED): Heparin Unfractionated: 0.56 [IU]/mL (ref 0.30–0.70)

## 2025-01-26 LAB — GLUCOSE, CAPILLARY: Glucose-Capillary: 102 mg/dL — ABNORMAL HIGH (ref 70–99)

## 2025-01-26 MED ORDER — SODIUM CHLORIDE 0.9 % IV SOLN: INTRAVENOUS | Status: DC

## 2025-01-26 MED ORDER — CEFAZOLIN SODIUM-DEXTROSE 2-4 GM/100ML-% IV SOLN
2.0000 g | INTRAVENOUS | Status: AC
  Administered 2025-01-27: 2 g via INTRAVENOUS

## 2025-01-26 NOTE — H&P (View-Only) (Signed)
 [facility]  Daily Progress Note   Subjective  -   Leg remains swollen and painful although somewhat better.  No major events overnight.  Tolerating anticoagulation.  Objective Vitals:   01/25/25 1559 01/25/25 2017 01/26/25 0356 01/26/25 0825  BP: 105/60 122/60 122/60 (!) 106/51  Pulse: 81 79 89 83  Resp: 17 16 16 16   Temp: 99.1 F (37.3 C) (!) 97.5 F (36.4 C) 98.2 F (36.8 C) 97.9 F (36.6 C)  TempSrc: Oral Oral    SpO2: 95% 96% 94% 95%  Weight:      Height:        Intake/Output Summary (Last 24 hours) at 01/26/2025 1058 Last data filed at 01/26/2025 1012 Gross per 24 hour  Intake 240 ml  Output --  Net 240 ml    PULM  CTAB CV  RRR VASC  left leg with 2-3+ edema.  Mild tenderness.  No open wounds.  Laboratory CBC    Component Value Date/Time   WBC 2.5 (L) 01/26/2025 0610   HGB 7.9 (L) 01/26/2025 0610   HGB 8.9 (L) 01/11/2025 1442   HCT 25.6 (L) 01/26/2025 0610   PLT 129 (L) 01/26/2025 0610   PLT 131 (L) 01/11/2025 1442    BMET    Component Value Date/Time   NA 136 01/25/2025 0440   K 4.2 01/25/2025 0440   CL 100 01/25/2025 0440   CO2 24 01/25/2025 0440   GLUCOSE 98 01/25/2025 0440   BUN 15 01/25/2025 0440   CREATININE 0.81 01/25/2025 0440   CALCIUM  8.4 (L) 01/25/2025 0440   GFRNONAA >60 01/25/2025 0440    Assessment/Planning: Fairly extensive left lower extremity DVT with mixed features of both acute and chronic thrombus.  Involves the common femoral vein although mostly in a nonocclusive fashion.  Patient's leg is some better after significant elevation and anticoagulation but still quite swollen and painful. She is very apprehensive due to her sister's passing from a pulmonary embolus We discussed options and she would prefer mechanical thrombectomy and we will plan this for tomorrow.  Risks and benefits were discussed.   Selinda Gu  01/26/2025, 10:58 AM

## 2025-01-26 NOTE — Plan of Care (Signed)
  Problem: Education: Goal: Knowledge of General Education information will improve Description: Including pain rating scale, medication(s)/side effects and non-pharmacologic comfort measures Outcome: Progressing   Problem: Health Behavior/Discharge Planning: Goal: Ability to manage health-related needs will improve Outcome: Progressing   Problem: Clinical Measurements: Goal: Will remain free from infection Outcome: Progressing Goal: Respiratory complications will improve Outcome: Progressing Goal: Cardiovascular complication will be avoided Outcome: Progressing   Problem: Activity: Goal: Risk for activity intolerance will decrease Outcome: Progressing   Problem: Coping: Goal: Level of anxiety will decrease Outcome: Progressing   Problem: Pain Managment: Goal: General experience of comfort will improve and/or be controlled Outcome: Progressing   Problem: Safety: Goal: Ability to remain free from injury will improve Outcome: Progressing

## 2025-01-26 NOTE — Plan of Care (Signed)

## 2025-01-26 NOTE — Progress Notes (Signed)
 [facility]  - [place] at [facility]   PATIENT NAME: Samantha Ford    MR#:  [id number]  DATE OF BIRTH:  1941-04-10  SUBJECTIVE:   Pt awake resting in bed this AM. Reports overall feeling well, denies complaints.  States left leg swelling is slightly better, less tense feeling today, but still uncomfortable.  States procedure is planned for tomorrow.  VITALS:  Blood pressure (!) 101/54, pulse 83, temperature 98.3 F (36.8 C), resp. rate 16, height 5' 6 (1.676 m), weight 59 kg, SpO2 96%.  PHYSICAL EXAMINATION:   General exam: awake, alert, no acute distress HEENT: moist mucus membranes, hearing grossly normal  Respiratory system: CTAB, no wheezes, rales or rhonchi, normal respiratory effort. Cardiovascular system: normal S1/S2, RRR, 1+ LLE edema, no RLE edema  Gastrointestinal system: soft, NT, ND, +bowel sounds. Central nervous system: A&O x 3. no gross focal neurologic deficits, normal speech Extremities: moves all,  1+ LLE edema, no RLE edema  Skin: dry, intact, normal temperature Psychiatry: normal mood, congruent affect, judgement and insight appear normal   LABORATORY PANEL:  CBC Recent Labs  Lab 01/26/25 0610  WBC 2.5*  HGB 7.9*  HCT 25.6*  PLT 129*    Chemistries  Recent Labs  Lab 01/25/25 0440  NA 136  K 4.2  CL 100  CO2 24  GLUCOSE 98  BUN 15  CREATININE 0.81  CALCIUM  8.4*   Cardiac Enzymes No results for input(s): TROPONINI in the last 168 hours. RADIOLOGY:  CT Angio Chest PE W and/or Wo Contrast Result Date: 01/24/2025 EXAM: CTA of the Chest with contrast for PE 01/24/2025 09:58:03 PM TECHNIQUE: CTA of the chest was performed without then with the administration of 75 mL of iohexol  (OMNIPAQUE ) 350 MG/ML injection. Multiplanar reformatted images are provided for review. MIP images are provided for review. Automated exposure control, iterative reconstruction, and/or weight based adjustment of the mA/kV was utilized to reduce the  radiation dose to as low as reasonably achievable. COMPARISON: PET CT 12/16/2024. CLINICAL HISTORY: Pulmonary embolism (PE) suspected, high probability. FINDINGS: PULMONARY ARTERIES: Pulmonary arteries are adequately opacified for evaluation. No pulmonary embolism. Main pulmonary arteries enlarged compatible with pulmonary artery hypertension. MEDIASTINUM: The heart is mildly enlarged. Coronary and aortic atherosclerotic calcifications are noted. There is no acute abnormality of the thoracic aorta. LYMPH NODES: Praminence but nonenlarged subcarinal lymph nodes appear unchanged. No enlarged axillary lymph nodes are seen. No other mediastinal or hilar lymphadenopathy. LUNGS AND PLEURA: Atelectatic changes and ground glass opacities are seen in the lingula and bilateral lower lobes. No pleural effusion or pneumothorax identified. UPPER ABDOMEN: There is a large hiatal hernia containing the proximal stomach. SOFT TISSUES AND BONES: Old left sided rib fractures. Bones are diffusely osteopenic. Lucent metastatic lesions are seen scattered throughout the vertebral bodies most significant at T3 and T6 which have progressed. T7 compression deformity, T12 and L1 compression deformities appear unchanged. No acute soft tissue abnormality. IMPRESSION: 1. No pulmonary embolism. 2. Enlarged main pulmonary arteries, consistent with pulmonary arterial hypertension. 3. Progression of lucent metastatic lesions in the vertebral bodies, most significant at T3 and T6. 4. Large hiatal hernia containing the proximal stomach. 5. Stable T7, T12, and L1 compression deformities. Electronically signed by: Greig Pique MD 01/24/2025 10:11 PM EST RP Workstation: [id number]    Assessment and Plan   Samantha Ford is a 84 y.o. female with medical history significant of stage IV breast cancer (s/p of right mastectomy, currently on radiation therapy), pathologic fracture with thoracic and lumbar  spine compression fracture, HLD,  GERD,  pneumothorax, arthritis, who presents with left leg pain and swelling.   Left lower extremity:Occlusive deep venous thrombosis is noted in the left superficial femoral vein. Nonocclusive deep venous thrombosis is noted in the left common femoral and profunda femoral veins.   CT chest angiogram 1. No pulmonary embolism. 2. Enlarged main pulmonary arteries, consistent with pulmonary arterial hypertension. 3. Progression of lucent metastatic lesions in the vertebral bodies, most significant at T3 and T6. 4. Large hiatal hernia containing the proximal stomach. 5. Stable T7, T12, and L1 compression deformities.   Acute DVT (deep venous thrombosis)_left leg: Left lower extremity venous Doppler showed nonocclusive deep venous thrombosis in theleft common femoral and profunda femoral veins, and occlusive deep venous thrombosis in the left superficial femoral vein.  CTA negative for PE. -heparin  drip initiated -pain control: prn Tylenol , Norco for pain -INR/PTT -Dr. Marea with Vascular surgery planning for mechanical thrombectomy tomorrow 2/6   Pancytopenia [facility]): Hemoglobin stable.   Patient had WBC 2.2, hemoglobin 8.9, platelet 137 on 01/11/2025 --> today WBC 2.5, hemoglobin 7.9, platelet 129. - Daily CBC to monitor   HLD (hyperlipidemia) -Pravastatin    Invasive carcinoma of breast [facility]): Patient is currently receiving radiation therapy, last dose was on 01/13/2025 per patient. - Continue letrozole    Hx of pathological fracture, and lumbar compression fracture, and thoracic compression fracture (HCC): -As needed Norco, Tylenol  - Fall precaution     DVT ppx: on IV Heparin     Code Status: Full code per pt Family Communication:  none at bedside on rounds Consults :Vascular surgery   Level of care: Telemetry Status is: Inpatient Remains inpatient appropriate because: Acute DVT on IV heparin  gtt    TOTAL TIME TAKING CARE OF THIS PATIENT: 40 minutes.  >50% time spent on counselling and  coordination of care  Note: This dictation was prepared with Dragon dictation along with smaller phrase technology. Any transcriptional errors that result from this process are unintentional.  Burnard DELENA Cunning, DO   [facility]   CC: Primary care physician; Diedra Lame, MD

## 2025-01-26 NOTE — Progress Notes (Signed)
 ANTICOAGULATION CONSULT NOTE  Pharmacy Consult for heparin  infusion Indication: VTE Tx  Allergies[1]  Patient Measurements: Height: 5' 6 (167.6 cm) Weight: 59 kg (130 lb) IBW/kg (Calculated) : 59.3 HEPARIN  DW (KG): 59  Vital Signs: Temp: 98.2 F (36.8 C) (02/05 0356) Temp Source: Oral (02/04 2017) BP: 122/60 (02/05 0356) Pulse Rate: 89 (02/05 0356)  Labs: Recent Labs    01/24/25 1538 01/24/25 2342 01/25/25 0440 01/25/25 0941 01/25/25 1707 01/26/25 0610  HGB 8.9*  --  8.3*  --   --  7.9*  HCT 28.3*  --  26.0*  --   --  25.6*  PLT 137*  --  120*  --   --  129*  APTT  --  28  --   --   --   --   LABPROT  --  13.5  --   --   --   --   INR  --  1.0  --   --   --   --   HEPARINUNFRC  --   --   --  0.59 0.57 0.56  CREATININE 0.89  --  0.81  --   --   --     Estimated Creatinine Clearance: 47.3 mL/min (by C-G formula based on SCr of 0.81 mg/dL).   Medical History: Past Medical History:  Diagnosis Date   Breast cancer (HCC) 10/22/2013   Mastectomy in 2015   Cancer [facility])    right breast 12/06/2013   GERD (gastroesophageal reflux disease)    Hyperlipidemia    Insomnia    Macular degeneration of right eye    Osteoarthritis    Pancytopenia (HCC)    Pneumonia    Pneumothorax     Assessment: Pt is a 84 yo female presenting to ED c/o sob, dizziness, left pain, and swelling, found with occlusive DVT is noted in the left superficial femoral vein and nonocclusive DVT is noted in the left common femoral and profunda femoral veins.  Goal of Therapy:  Heparin  level 0.3-0.7 units/ml Monitor platelets by anticoagulation protocol: Yes   02/04 0941 0.59, therapeutic x 1 02/04 1707 0.57, therapeutic x2 02/05 0610 0.56, therapeutic x 3  Plan:  HL therapeutic x 3 Continue heparin  infusion at 950 units/hr Recheck HL  daily with AM labs give x2 consecutive therapeutic levels  Daily CBC while on heparin   Bari Glendia BIRCH, PharmD Clinical Pharmacist 01/26/2025 8:01 AM           [1] No Known Allergies

## 2025-01-26 NOTE — Progress Notes (Signed)
 [facility]  Daily Progress Note   Subjective  -   Leg remains swollen and painful although somewhat better.  No major events overnight.  Tolerating anticoagulation.  Objective Vitals:   01/25/25 1559 01/25/25 2017 01/26/25 0356 01/26/25 0825  BP: 105/60 122/60 122/60 (!) 106/51  Pulse: 81 79 89 83  Resp: 17 16 16 16   Temp: 99.1 F (37.3 C) (!) 97.5 F (36.4 C) 98.2 F (36.8 C) 97.9 F (36.6 C)  TempSrc: Oral Oral    SpO2: 95% 96% 94% 95%  Weight:      Height:        Intake/Output Summary (Last 24 hours) at 01/26/2025 1058 Last data filed at 01/26/2025 1012 Gross per 24 hour  Intake 240 ml  Output --  Net 240 ml    PULM  CTAB CV  RRR VASC  left leg with 2-3+ edema.  Mild tenderness.  No open wounds.  Laboratory CBC    Component Value Date/Time   WBC 2.5 (L) 01/26/2025 0610   HGB 7.9 (L) 01/26/2025 0610   HGB 8.9 (L) 01/11/2025 1442   HCT 25.6 (L) 01/26/2025 0610   PLT 129 (L) 01/26/2025 0610   PLT 131 (L) 01/11/2025 1442    BMET    Component Value Date/Time   NA 136 01/25/2025 0440   K 4.2 01/25/2025 0440   CL 100 01/25/2025 0440   CO2 24 01/25/2025 0440   GLUCOSE 98 01/25/2025 0440   BUN 15 01/25/2025 0440   CREATININE 0.81 01/25/2025 0440   CALCIUM  8.4 (L) 01/25/2025 0440   GFRNONAA >60 01/25/2025 0440    Assessment/Planning: Fairly extensive left lower extremity DVT with mixed features of both acute and chronic thrombus.  Involves the common femoral vein although mostly in a nonocclusive fashion.  Patient's leg is some better after significant elevation and anticoagulation but still quite swollen and painful. She is very apprehensive due to her sister's passing from a pulmonary embolus We discussed options and she would prefer mechanical thrombectomy and we will plan this for tomorrow.  Risks and benefits were discussed.   Selinda Gu  01/26/2025, 10:58 AM

## 2025-01-27 ENCOUNTER — Encounter: Payer: Self-pay | Admitting: Vascular Surgery

## 2025-01-27 ENCOUNTER — Encounter: Admission: EM | Payer: Self-pay | Source: Home / Self Care | Attending: Internal Medicine

## 2025-01-27 LAB — TYPE AND SCREEN
ABO/RH(D): A NEG
Antibody Screen: NEGATIVE
Unit division: 0

## 2025-01-27 LAB — CBC
HCT: 23.8 % — ABNORMAL LOW (ref 36.0–46.0)
Hemoglobin: 7.2 g/dL — ABNORMAL LOW (ref 12.0–15.0)
MCH: 27.8 pg (ref 26.0–34.0)
MCHC: 30.3 g/dL (ref 30.0–36.0)
MCV: 91.9 fL (ref 80.0–100.0)
Platelets: 126 10*3/uL — ABNORMAL LOW (ref 150–400)
RBC: 2.59 MIL/uL — ABNORMAL LOW (ref 3.87–5.11)
RDW: 17 % — ABNORMAL HIGH (ref 11.5–15.5)
WBC: 2.5 10*3/uL — ABNORMAL LOW (ref 4.0–10.5)
nRBC: 4.3 % — ABNORMAL HIGH (ref 0.0–0.2)

## 2025-01-27 LAB — GLUCOSE, CAPILLARY: Glucose-Capillary: 105 mg/dL — ABNORMAL HIGH (ref 70–99)

## 2025-01-27 LAB — IRON AND TIBC
Iron: 58 ug/dL (ref 28–170)
Saturation Ratios: 23 % (ref 10.4–31.8)
TIBC: 255 ug/dL (ref 250–450)
UIBC: 197 ug/dL

## 2025-01-27 LAB — RETICULOCYTES
Immature Retic Fract: 35.8 % — ABNORMAL HIGH (ref 2.3–15.9)
RBC.: 2.62 MIL/uL — ABNORMAL LOW (ref 3.87–5.11)
Retic Count, Absolute: 98.2 10*3/uL (ref 19.0–186.0)
Retic Ct Pct: 3.8 % — ABNORMAL HIGH (ref 0.4–3.1)

## 2025-01-27 LAB — BPAM RBC
Blood Product Expiration Date: 202602152359
ISSUE DATE / TIME: 202602061845
Unit Type and Rh: 600

## 2025-01-27 LAB — HEPARIN LEVEL (UNFRACTIONATED)
Heparin Unfractionated: 0.62 [IU]/mL (ref 0.30–0.70)
Heparin Unfractionated: 0.35 [IU]/mL (ref 0.30–0.70)

## 2025-01-27 LAB — FERRITIN: Ferritin: 1075 ng/mL — ABNORMAL HIGH (ref 11–307)

## 2025-01-27 LAB — FOLATE: Folate: 4.7 ng/mL — ABNORMAL LOW

## 2025-01-27 LAB — VITAMIN B12: Vitamin B-12: 226 pg/mL (ref 180–914)

## 2025-01-27 LAB — HEMOGLOBIN AND HEMATOCRIT, BLOOD
HCT: 22.4 % — ABNORMAL LOW (ref 36.0–46.0)
Hemoglobin: 6.9 g/dL — ABNORMAL LOW (ref 12.0–15.0)

## 2025-01-27 LAB — PREPARE RBC (CROSSMATCH)

## 2025-01-27 MED ORDER — FOLIC ACID 1 MG PO TABS
1.0000 mg | ORAL_TABLET | Freq: Every day | ORAL | Status: AC
  Administered 2025-01-27: 1 mg via ORAL
  Filled 2025-01-27: qty 1

## 2025-01-27 MED ORDER — FENTANYL CITRATE (PF) 100 MCG/2ML IJ SOLN
INTRAMUSCULAR | Status: AC
  Filled 2025-01-27: qty 2

## 2025-01-27 MED ORDER — HEPARIN SODIUM (PORCINE) 1000 UNIT/ML IJ SOLN
INTRAMUSCULAR | Status: DC | PRN
  Administered 2025-01-27: 3000 [IU] via INTRAVENOUS

## 2025-01-27 MED ORDER — FENTANYL CITRATE (PF) 100 MCG/2ML IJ SOLN
INTRAMUSCULAR | Status: DC | PRN
  Administered 2025-01-27: 25 ug via INTRAVENOUS
  Administered 2025-01-27: 50 ug via INTRAVENOUS

## 2025-01-27 MED ORDER — CEFAZOLIN SODIUM-DEXTROSE 2-4 GM/100ML-% IV SOLN
INTRAVENOUS | Status: AC
  Filled 2025-01-27: qty 100

## 2025-01-27 MED ORDER — IODIXANOL 320 MG/ML IV SOLN
INTRAVENOUS | Status: DC | PRN
  Administered 2025-01-27: 45 mL

## 2025-01-27 MED ORDER — SODIUM CHLORIDE 0.9% IV SOLUTION: Freq: Once | INTRAVENOUS | Status: AC

## 2025-01-27 MED ORDER — HEPARIN (PORCINE) 25000 UT/250ML-% IV SOLN
950.0000 [IU]/h | INTRAVENOUS | Status: AC
  Administered 2025-01-27: 950 [IU]/h via INTRAVENOUS
  Filled 2025-01-27: qty 250

## 2025-01-27 MED ORDER — HEPARIN SODIUM (PORCINE) 1000 UNIT/ML IJ SOLN
INTRAMUSCULAR | Status: AC
  Filled 2025-01-27: qty 10

## 2025-01-27 MED ORDER — CEFAZOLIN SODIUM-DEXTROSE 1-4 GM/50ML-% IV SOLN
INTRAVENOUS | Status: AC
  Filled 2025-01-27: qty 50

## 2025-01-27 MED ORDER — LIDOCAINE-EPINEPHRINE (PF) 1 %-1:200000 IJ SOLN
INTRAMUSCULAR | Status: DC | PRN
  Administered 2025-01-27: 10 mL

## 2025-01-27 MED ORDER — HEPARIN (PORCINE) IN NACL 1000-0.9 UT/500ML-% IV SOLN
INTRAVENOUS | Status: DC | PRN
  Administered 2025-01-27: 1000 mL

## 2025-01-27 MED ORDER — SODIUM CHLORIDE 0.9 % IV BOLUS
500.0000 mL | Freq: Once | INTRAVENOUS | Status: AC
  Administered 2025-01-27: 500 mL via INTRAVENOUS

## 2025-01-27 MED ORDER — MIDAZOLAM HCL 5 MG/5ML IJ SOLN
INTRAMUSCULAR | Status: AC
  Filled 2025-01-27: qty 5

## 2025-01-27 MED ORDER — HYDROMORPHONE HCL 1 MG/ML IJ SOLN
0.5000 mg | Freq: Once | INTRAMUSCULAR | Status: AC
  Administered 2025-01-27: 0.5 mg via INTRAVENOUS
  Filled 2025-01-27: qty 0.5

## 2025-01-27 MED ORDER — ONDANSETRON HCL 4 MG/2ML IJ SOLN
INTRAMUSCULAR | Status: DC | PRN
  Administered 2025-01-27: 4 mg via INTRAVENOUS

## 2025-01-27 MED ORDER — ONDANSETRON HCL 4 MG/2ML IJ SOLN
INTRAMUSCULAR | Status: AC
  Filled 2025-01-27: qty 2

## 2025-01-27 MED ORDER — MIDAZOLAM HCL (PF) 2 MG/2ML IJ SOLN
INTRAMUSCULAR | Status: DC | PRN
  Administered 2025-01-27: 1 mg via INTRAVENOUS

## 2025-01-27 NOTE — Care Management Important Message (Signed)
 Important Message  Patient Details  Name: Samantha Ford MRN: [id number] Date of Birth: [birth year]   Important Message Given:  Yes - Medicare IM     [name] 01/27/2025, 2:37 PM

## 2025-01-27 NOTE — Interval H&P Note (Signed)
 History and Physical Interval Note:  01/27/2025 9:06 AM  Samantha Ford  has presented today for surgery, with the diagnosis of left leg DVT.  The various methods of treatment have been discussed with the patient and family. After consideration of risks, benefits and other options for treatment, the patient has consented to  Procedures: PERIPHERAL VASCULAR THROMBECTOMY (Left) as a surgical intervention.  The patient's history has been reviewed, patient examined, no change in status, stable for surgery.  I have reviewed the patient's chart and labs.  Questions were answered to the patient's satisfaction.     Samantha Ford

## 2025-01-27 NOTE — OR Nursing (Signed)
 Fluid bolus after pt reporting feeling like I am leaving my body Dr Marea aware of drop in blood pressure.

## 2025-01-27 NOTE — Progress Notes (Addendum)
 [facility]  - [place] at [facility]   PATIENT NAME: Samantha Ford    MR#:  [id number]  DATE OF BIRTH:  [birth year]  SUBJECTIVE:   Pt seen with family at bedside after procedure this AM. She reports a headache from not having her morning coffee yet.  Denies other acute complaints.   VITALS:  Blood pressure (!) 112/40, pulse 78, temperature 98.4 F (36.9 C), temperature source Oral, resp. rate 16, height 5' 6 (1.676 m), weight 59 kg, SpO2 99%.  PHYSICAL EXAMINATION:   General exam: awake, alert, no acute distress HEENT: moist mucus membranes, hearing grossly normal  Respiratory system: CTAB, no wheezes, rales or rhonchi, normal respiratory effort. Cardiovascular system: normal S1/S2, RRR, 1+ LLE edema, no RLE edema  Gastrointestinal system: soft, NT, ND, +bowel sounds. Central nervous system: A&O x 3. no gross focal neurologic deficits, normal speech Extremities: moves all,  1+ LLE edema, no RLE edema  Skin: dry, intact, normal temperature Psychiatry: normal mood, congruent affect, judgement and insight appear normal   LABORATORY PANEL:  CBC Recent Labs  Lab 01/27/25 0540  WBC 2.5*  HGB 7.2*  HCT 23.8*  PLT 126*    Chemistries  Recent Labs  Lab 01/25/25 0440  NA 136  K 4.2  CL 100  CO2 24  GLUCOSE 98  BUN 15  CREATININE 0.81  CALCIUM  8.4*   Cardiac Enzymes No results for input(s): TROPONINI in the last 168 hours. RADIOLOGY:  PERIPHERAL VASCULAR CATHETERIZATION Result Date: 01/27/2025 See surgical note for result.   Assessment and Plan   Kessie Croston is a 84 y.o. female with medical history significant of stage IV breast cancer (s/p of right mastectomy, currently on radiation therapy), pathologic fracture with thoracic and lumbar spine compression fracture, HLD,  GERD, pneumothorax, arthritis, who presents with left leg pain and swelling.   Left lower extremity:Occlusive deep venous thrombosis is noted in the left superficial femoral  vein. Nonocclusive deep venous thrombosis is noted in the left common femoral and profunda femoral veins.   CT chest angiogram 1. No pulmonary embolism. 2. Enlarged main pulmonary arteries, consistent with pulmonary arterial hypertension. 3. Progression of lucent metastatic lesions in the vertebral bodies, most significant at T3 and T6. 4. Large hiatal hernia containing the proximal stomach. 5. Stable T7, T12, and L1 compression deformities.   Acute DVT (deep venous thrombosis)_left leg: Left lower extremity venous Doppler showed nonocclusive deep venous thrombosis in theleft common femoral and profunda femoral veins, and occlusive deep venous thrombosis in the left superficial femoral vein.  CTA negative for PE. -heparin  drip initiated -pain control: prn Tylenol , Norco for pain -INR/PTT -Dr. Marea with Vascular surgery -- mechanical thrombectomy done this AM -resume heparin  this evening, possible transition to Eliquis tomorrow depending on anemia   Pancytopenia (HCC): Hemoglobin stable.   Patient had WBC 2.2, hemoglobin 8.9, platelet 137 on 01/11/2025 -->  today WBC 2.5, hemoglobin 7.9 >> 7.2, platelet 126. - Repeat Hbg this afternoon - Type & screen - Transfuse RBC's for Hbg < 7 - pt consented to transfusion - Daily CBC to monitor - check anemia panel  2/6 PM addendum - Hbg repeat is 6.9 - 1 unit PRBCs ordered for transfusion. Pt gave consent on AM rounds this morning  Folic acid  deficiency - folic acid  level 4.7 - start folic acid  supplementation    HLD (hyperlipidemia) -Pravastatin    Invasive carcinoma of breast [facility]): Patient is currently receiving radiation therapy, last dose was on 01/13/2025 per patient. - Continue  letrozole    Hx of pathological fracture, and lumbar compression fracture, and thoracic compression fracture (HCC): -As needed Norco, Tylenol  - Fall precaution     DVT ppx: on IV Heparin     Code Status: Full code per pt Family Communication:  3 family  members at bedside on rounds  Consults :Vascular surgery Procedures: LLE DVT mechanical thrombectomy   Level of care: Telemetry Status is: Inpatient Remains inpatient appropriate because: Acute DVT on IV heparin  gtt    TOTAL TIME TAKING CARE OF THIS PATIENT: 40 minutes.  >50% time spent on counselling and coordination of care  Note: This dictation was prepared with Dragon dictation along with smaller phrase technology. Any transcriptional errors that result from this process are unintentional.  Burnard DELENA Cunning, DO   [facility]   CC: Primary care physician; Diedra Lame, MD

## 2025-01-27 NOTE — Op Note (Signed)
 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   US  guidance for vascular access to left popliteal vein 2.   Catheter placement into left external iliac vein from left popliteal approach 3.   IVC gram and left lower extremity venogram 4.   Mechanical thrombectomy to left femoral vein, common femoral vein, external and common iliac veins with the penumbra 16 flash device 3.0. 5.   PTA of the left common iliac vein with 12 mm balloon    SURGEON: Selinda Gu, MD  ASSISTANT(S): none  ANESTHESIA: local with moderate conscious sedation for 41 minutes using 1 mg of Versed  and 75 mcg of Fentanyl   ESTIMATED BLOOD LOSS: 175 cc  FINDING(S): 1.  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 female who presents with extensive left lower extremity DVT.  Patient has marked leg swelling and pain.  Venous intervention is performed to reduce the symtpoms and avoid long term postphlebitic symptoms.    DESCRIPTION: After obtaining full informed written consent, the patient was brought back to the vascular suite and placed supine upon the table. Moderate conscious sedation was administered during a face to face encounter with the patient throughout the procedure with my supervision of the RN administering medicines and monitoring the patient's vital signs, pulse oximetry, telemetry and mental status throughout from the start of the procedure until the patient was taken to the recovery room.  After obtaining adequate anesthesia, the patient was prepped and draped in the standard fashion.    The patient was then placed into the prone position.  The left popliteal vein was then accessed under direct ultrasound  guidance without difficulty with a micropuncture needle and a permanent image was recorded.  I then upsized to an 6 Fr sheath over a J wire.  At this point, a ProGlide device was placed over the J-wire in a preclose fashion.  I then upsized to a 17 French sheath.  3000 units of heparin  were then given.  Imaging showed extensive DVT with minimal flow.  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  I was able to cross the thrombus and stenosis and advance into the IVC which was patent. I used the Penumbra Cat 16 flash 3.0 catheter and evacuated about 175 cc of effluent with mechanical thrombectomy throughout the iliac veins, common femoral vein, and femoral vein.  This had marked improvement.  There was near resolution of the thrombus but there was still a high-grade narrowing of the left common iliac vein. I then turned my attention to the iliac veins.  The stenosis/occlusion and small amount of residual thrombus was treated with a 12 mm diameter angioplasty balloon.  Completion imaging showed marked improvement with only about a 20% residual stenosis in the left common iliac vein.  I then elected to terminate the procedure.  The sheath was removed, the Pro-glide device was secured and a 4-0 Monocryl was placed at the skin incision and a dressing was placed.  Patient was taken to the recovery room in stable condition having tolerated the procedure well.    COMPLICATIONS: None  CONDITION: Stable  Selinda Gu 01/27/2025 10:41 AM

## 2025-01-27 NOTE — Progress Notes (Signed)
 ANTICOAGULATION CONSULT NOTE  Pharmacy Consult for heparin  infusion Indication: VTE Tx  Allergies[1]  Patient Measurements: Height: 5' 6 (167.6 cm) Weight: 59 kg (130 lb) IBW/kg (Calculated) : 59.3 HEPARIN  DW (KG): 59  Vital Signs: Temp: 97.5 F (36.4 C) (02/06 0433) BP: 112/46 (02/06 0433) Pulse Rate: 83 (02/06 0433)  Labs: Recent Labs    01/24/25 1538 01/24/25 2342 01/25/25 0440 01/25/25 0941 01/25/25 1707 01/26/25 0610 01/27/25 0540  HGB 8.9*  --  8.3*  --   --  7.9* 7.2*  HCT 28.3*  --  26.0*  --   --  25.6* 23.8*  PLT 137*  --  120*  --   --  129* 126*  APTT  --  28  --   --   --   --   --   LABPROT  --  13.5  --   --   --   --   --   INR  --  1.0  --   --   --   --   --   HEPARINUNFRC  --   --   --    < > 0.57 0.56 0.62  CREATININE 0.89  --  0.81  --   --   --   --    < > = values in this interval not displayed.    Estimated Creatinine Clearance: 47.3 mL/min (by C-G formula based on SCr of 0.81 mg/dL).   Medical History: Past Medical History:  Diagnosis Date   Breast cancer (HCC) 10/22/2013   Mastectomy in 2015   Cancer [facility])    right breast 12/06/2013   GERD (gastroesophageal reflux disease)    Hyperlipidemia    Insomnia    Macular degeneration of right eye    Osteoarthritis    Pancytopenia (HCC)    Pneumonia    Pneumothorax     Assessment: Pt is a 84 yo female presenting to ED c/o sob, dizziness, left pain, and swelling, found with occlusive DVT is noted in the left superficial femoral vein and nonocclusive DVT is noted in the left common femoral and profunda femoral veins.  Goal of Therapy:  Heparin  level 0.3-0.7 units/ml Monitor platelets by anticoagulation protocol: Yes   02/04 0941 0.59, therapeutic x 1 02/04 1707 0.57, therapeutic x 2 02/05 0610 0.56, therapeutic x 3 02/06 0540 0.62, therapeutic x 3  Plan:  HL therapeutic x 4 Continue heparin  infusion at 950 units/hr Will f/u after thrombectomy today for anticoagulation  plans Recheck HL  daily with AM labs  Daily CBC while on heparin   Bari Glendia BIRCH, PharmD Clinical Pharmacist 01/27/2025 7:23 AM           [1] No Known Allergies

## 2025-02-13 ENCOUNTER — Ambulatory Visit: Admitting: Radiation Oncology

## 2025-02-16 ENCOUNTER — Inpatient Hospital Stay: Admitting: Hospice and Palliative Medicine

## 2025-02-16 ENCOUNTER — Inpatient Hospital Stay

## 2025-02-16 ENCOUNTER — Inpatient Hospital Stay: Admitting: Oncology

## 2025-03-07 ENCOUNTER — Inpatient Hospital Stay: Admitting: Hospice and Palliative Medicine
